# Patient Record
Sex: Female | Born: 1985 | Race: White | Hispanic: No | Marital: Married | Smoking: Current every day smoker
Health system: Southern US, Community
[De-identification: ages and names within clinical notes are randomized; demographics above are authoritative.]

## PROBLEM LIST (undated history)

## (undated) DIAGNOSIS — M51369 Other intervertebral disc degeneration, lumbar region without mention of lumbar back pain or lower extremity pain: Secondary | ICD-10-CM

## (undated) DIAGNOSIS — M5136 Other intervertebral disc degeneration, lumbar region: Secondary | ICD-10-CM

## (undated) DIAGNOSIS — N390 Urinary tract infection, site not specified: Secondary | ICD-10-CM

## (undated) DIAGNOSIS — B977 Papillomavirus as the cause of diseases classified elsewhere: Secondary | ICD-10-CM

## (undated) DIAGNOSIS — F17200 Nicotine dependence, unspecified, uncomplicated: Secondary | ICD-10-CM

## (undated) HISTORY — DX: Other intervertebral disc degeneration, lumbar region without mention of lumbar back pain or lower extremity pain: M51.369

## (undated) HISTORY — DX: Nicotine dependence, unspecified, uncomplicated: F17.200

## (undated) HISTORY — PX: TONSILLECTOMY AND ADENOIDECTOMY: SUR1326

## (undated) HISTORY — DX: Papillomavirus as the cause of diseases classified elsewhere: B97.7

## (undated) HISTORY — DX: Urinary tract infection, site not specified: N39.0

## (undated) HISTORY — DX: Other intervertebral disc degeneration, lumbar region: M51.36

---

## 2006-05-27 ENCOUNTER — Emergency Department: Payer: Self-pay | Admitting: Emergency Medicine

## 2008-06-14 ENCOUNTER — Ambulatory Visit: Payer: Self-pay | Admitting: Family Medicine

## 2009-03-25 ENCOUNTER — Ambulatory Visit: Payer: Self-pay | Admitting: Internal Medicine

## 2009-08-23 ENCOUNTER — Emergency Department: Payer: Self-pay | Admitting: Emergency Medicine

## 2010-03-01 ENCOUNTER — Ambulatory Visit: Payer: Self-pay | Admitting: Unknown Physician Specialty

## 2013-06-26 ENCOUNTER — Ambulatory Visit: Payer: Self-pay | Admitting: Family Medicine

## 2013-09-06 ENCOUNTER — Emergency Department: Payer: Self-pay | Admitting: Emergency Medicine

## 2013-09-16 ENCOUNTER — Ambulatory Visit: Payer: Self-pay | Admitting: Family Medicine

## 2015-06-20 ENCOUNTER — Encounter: Payer: Self-pay | Admitting: Family Medicine

## 2015-06-20 ENCOUNTER — Ambulatory Visit (INDEPENDENT_AMBULATORY_CARE_PROVIDER_SITE_OTHER): Payer: Managed Care, Other (non HMO) | Admitting: Family Medicine

## 2015-06-20 VITALS — BP 99/67 | HR 68 | Resp 16 | Ht 65.0 in | Wt 213.0 lb

## 2015-06-20 DIAGNOSIS — M5136 Other intervertebral disc degeneration, lumbar region: Secondary | ICD-10-CM

## 2015-06-20 MED ORDER — CYCLOBENZAPRINE HCL 10 MG PO TABS: ORAL_TABLET | ORAL | Status: DC

## 2015-06-20 MED ORDER — MELOXICAM 7.5 MG PO TABS: ORAL_TABLET | ORAL | Status: DC

## 2015-06-20 NOTE — Patient Instructions (Signed)
Alternate ice and heat to low back area.
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
## 2015-10-29 ENCOUNTER — Ambulatory Visit (INDEPENDENT_AMBULATORY_CARE_PROVIDER_SITE_OTHER): Payer: Managed Care, Other (non HMO) | Admitting: Family Medicine

## 2015-10-29 VITALS — BP 113/75 | HR 79 | Temp 97.9°F | Resp 16 | Ht 65.0 in | Wt 216.0 lb

## 2015-10-29 DIAGNOSIS — R61 Generalized hyperhidrosis: Secondary | ICD-10-CM

## 2015-10-29 DIAGNOSIS — M5136 Other intervertebral disc degeneration, lumbar region: Secondary | ICD-10-CM | POA: Diagnosis not present

## 2015-10-29 MED ORDER — KETOROLAC TROMETHAMINE 30 MG/ML IJ SOLN: 30.0000 mg | Freq: Once | INTRAMUSCULAR | Status: DC

## 2015-10-29 MED ORDER — CYCLOBENZAPRINE HCL 10 MG PO TABS: ORAL_TABLET | ORAL | Status: DC

## 2015-10-29 MED ORDER — KETOROLAC TROMETHAMINE 10 MG PO TABS: 10.0000 mg | ORAL_TABLET | Freq: Four times a day (QID) | ORAL | Status: DC | PRN

## 2015-10-29 MED ORDER — KETOROLAC TROMETHAMINE 30 MG/ML IJ SOLN
30.0000 mg | Freq: Once | INTRAMUSCULAR | Status: AC
  Administered 2015-10-29: 30 mg via INTRAMUSCULAR

## 2015-10-29 MED ORDER — ALUMINUM CHLORIDE 20 % EX SOLN: Freq: Every day | CUTANEOUS | Status: DC

## 2015-10-29 NOTE — Patient Instructions (Addendum)
If you develop progressive numbness, weakness, loss of feeling in your pelvis, or loss of bowel/bladder control please seek immediate medical attention.   We will give you a toradol injection today.  6-8 hours after your injection you can take a toradol pill.  Repeat every 8 hours until bottle is empty as needed.  Use muscle relaxants as needed.  Try core strengthening to prevent back injury- focus on strengthening your transverse abdominal muscles.
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
## 2015-10-29 NOTE — Assessment & Plan Note (Signed)
Toradol injection for pain. PO toradol for home. Continue meloxicam 24 hours after completing toradol.  Refill flexeril PRN.  Consider PT.

## 2016-02-01 ENCOUNTER — Ambulatory Visit
Admission: EM | Admit: 2016-02-01 | Discharge: 2016-02-01 | Disposition: A | Discharge status: Home / Self Care | Payer: Managed Care, Other (non HMO) | Attending: Family Medicine | Admitting: Family Medicine

## 2016-02-01 ENCOUNTER — Encounter: Payer: Self-pay | Admitting: *Deleted

## 2016-02-01 DIAGNOSIS — J0100 Acute maxillary sinusitis, unspecified: Secondary | ICD-10-CM

## 2016-02-01 MED ORDER — FLUCONAZOLE 150 MG PO TABS: ORAL_TABLET | ORAL | Status: DC

## 2016-02-01 MED ORDER — AMOXICILLIN-POT CLAVULANATE 875-125 MG PO TABS: 1.0000 | ORAL_TABLET | Freq: Two times a day (BID) | ORAL | Status: DC

## 2016-02-01 MED ORDER — HYDROCOD POLST-CPM POLST ER 10-8 MG/5ML PO SUER: 5.0000 mL | Freq: Two times a day (BID) | ORAL | Status: DC

## 2016-02-01 NOTE — ED Notes (Signed)
Bilat ear pain x2 weeks, onset yesterday of headache, runny nose, productive cough- green, and chest soreness with coughing and movement. Denies fever.

## 2016-02-01 NOTE — ED Provider Notes (Signed)
CSN: [id number]     Arrival date & time 02/01/16  1134 History   First MD Initiated Contact with Patient 02/01/16 1308     Chief Complaint  Patient presents with  . Cough  . Headache  . Nasal Congestion  . Pleurisy  . Otalgia   (Consider location/radiation/quality/duration/timing/severity/associated sxs/prior Treatment) HPI  This a 30 year old female who presents with a two-week history of bilateral ear pain, postnasal drip, headache and runny nose. She also complains of a productive cough green sputum and chest soreness when she is coughing. Denies any fever or chills. She does have a great deal of coughing at nighttime. Denies any wheezing. She's had no fever or chills and is afebrile today.    Past Medical History  Diagnosis Date  . UTI (urinary tract infection)   . Smoker    Past Surgical History  Procedure Laterality Date  . Tonsillectomy and adenoidectomy     Family History  Problem Relation Age of Onset  . COPD Father    Social History  Substance Use Topics  . Smoking status: Current Every Day Smoker -- 0.50 packs/day    Types: Cigarettes  . Smokeless tobacco: Never Used  . Alcohol Use: Yes   OB History    No data available     Review of Systems  Constitutional: Positive for activity change. Negative for fever, chills and fatigue.  HENT: Positive for congestion, ear pain, postnasal drip, rhinorrhea, sinus pressure, sneezing and sore throat.   All other systems reviewed and are negative.   Allergies  Review of patient's allergies indicates no known allergies.  Home Medications   Prior to Admission medications   Medication Sig Start Date End Date Taking? Authorizing Provider  aluminum chloride (DRYSOL) 20 % external solution Apply topically at bedtime. 10/29/15  Yes Amy Rusty Aus, NP  amoxicillin-clavulanate (AUGMENTIN) 875-125 MG tablet Take 1 tablet by mouth every 12 (twelve) hours. 02/01/16   Lutricia Feil, PA-C  chlorpheniramine-HYDROcodone  (TUSSIONEX PENNKINETIC ER) 10-8 MG/5ML SUER Take 5 mLs by mouth 2 (two) times daily. 02/01/16   Lutricia Feil, PA-C  cyclobenzaprine (FLEXERIL) 10 MG tablet 1/2-1 tablet up to three times a day for muscle spasm. 10/29/15   Amy Rusty Aus, NP  fluconazole (DIFLUCAN) 150 MG tablet Take one tab for symptoms of yeast infection. Repeat x 1 in 72 hours. 02/01/16   Lutricia Feil, PA-C  ibuprofen (ADVIL,MOTRIN) 600 MG tablet Take 600 mg by mouth every 6 (six) hours as needed.    Historical Provider, MD  meloxicam (MOBIC) 7.5 MG tablet 1-2 tabs once daily for back pain 06/20/15   Janeann Forehand., MD   Meds Ordered and Administered this Visit  Medications - No data to display  BP 115/73 mmHg  Pulse 78  Temp(Src) 98.2 F (36.8 C) (Oral)  Resp 16  Ht  (1.651 m)  Wt 205 lb (92.987 kg)  BMI 34.11 kg/m2  SpO2 97%  LMP 01/28/2016 (Exact Date) No data found.   Physical Exam  Constitutional: She is oriented to person, place, and time. She appears well-developed and well-nourished. No distress.  HENT:  Head: Normocephalic and atraumatic.  Right Ear: External ear normal.  Left Ear: External ear normal.  Nose: Nose normal.  Mouth/Throat: Oropharynx is clear and moist. No oropharyngeal exudate.  Eyes: Conjunctivae are normal. Pupils are equal, round, and reactive to light.  Neck: Normal range of motion. Neck supple.  Pulmonary/Chest: Effort normal and breath sounds normal. No stridor. No  respiratory distress. She has no wheezes. She has no rales.  Musculoskeletal: Normal range of motion. She exhibits no edema or tenderness.  Lymphadenopathy:    She has no cervical adenopathy.  Neurological: She is alert and oriented to person, place, and time.  Skin: Skin is warm and dry. No rash noted. She is not diaphoretic. No erythema.  Psychiatric: She has a normal mood and affect. Her behavior is normal. Judgment and thought content normal.  Nursing note and vitals reviewed.   ED Course   Procedures (including critical care time)  Labs Review Labs Reviewed - No data to display  Imaging Review No results found.   Visual Acuity Review  Right Eye Distance:   Left Eye Distance:   Bilateral Distance:    Right Eye Near:   Left Eye Near:    Bilateral Near:         MDM   1. Acute maxillary sinusitis, recurrence not specified    Discharge Medication List as of 02/01/2016  1:34 PM    START taking these medications   Details  amoxicillin-clavulanate (AUGMENTIN) 875-125 MG tablet Take 1 tablet by mouth every 12 (twelve) hours., Starting 02/01/2016, Until Discontinued, Normal    chlorpheniramine-HYDROcodone (TUSSIONEX PENNKINETIC ER) 10-8 MG/5ML SUER Take 5 mLs by mouth 2 (two) times daily., Starting 02/01/2016, Until Discontinued, Print    fluconazole (DIFLUCAN) 150 MG tablet Take one tab for symptoms of yeast infection. Repeat x 1 in 72 hours., Normal      Plan: 1. Test/x-ray results and diagnosis reviewed with patient 2. rx as per orders; risks, benefits, potential side effects reviewed with patient 3. Recommend supportive treatment with Rest and fluids. Recommend use of Flonase for her nasal symptoms. She has had previous symptoms for about 2 weeks believe that antibiotics would be prudent at this point. She states that she is able to used infections as of also provide her with Diflucan is not improving or is worsening she should follow-up with her primary care physician 4. F/u prn if symptoms worsen or don't improve     Lutricia FeilWilliam P Roemer, PA-C 02/01/16 1353

## 2016-02-01 NOTE — Discharge Instructions (Signed)
Sinus Rinse WHAT IS A SINUS RINSE? A sinus rinse is a simple home treatment that is used to rinse your sinuses with a sterile mixture of salt and water (saline solution). Sinuses are air-filled spaces in your skull behind the bones of your face and forehead that open into your nasal cavity. You will use the following:  Saline solution.  Neti pot or spray bottle. This releases the saline solution into your nose and through your sinuses. Neti pots and spray bottles can be purchased at your local pharmacy, a health food store, or online. WHEN WOULD I DO A SINUS RINSE? A sinus rinse can help to clear mucus, dirt, dust, or pollen from the nasal cavity. You may do a sinus rinse when you have a cold, a virus, nasal allergy symptoms, a sinus infection, or stuffiness in the nose or sinuses. If you are considering a sinus rinse:  Ask your child's health care provider before performing a sinus rinse on your child.  Do not do a sinus rinse if you have had ear or nasal surgery, ear infection, or blocked ears. HOW DO I DO A SINUS RINSE?  Wash your hands.  Disinfect your device according to the directions provided and then dry it.  Use the solution that comes with your device or one that is sold separately in stores. Follow the mixing directions on the package.  Fill your device with the amount of saline solution as directed by the device instructions.  Stand over a sink and tilt your head sideways over the sink.  Place the spout of the device in your upper nostril (the one closer to the ceiling).  Gently pour or squeeze the saline solution into the nasal cavity. The liquid should drain to the lower nostril if you are not overly congested.  Gently blow your nose. Blowing too hard may cause ear pain.  Repeat in the other nostril.  Clean and rinse your device with clean water and then air-dry it. ARE THERE RISKS OF A SINUS RINSE?  Sinus rinse is generally very safe and effective. However, there  are a few risks, which include:   A burning sensation in the sinuses. This may happen if you do not make the saline solution as directed. Make sure to follow all directions when making the saline solution.  Infection from contaminated water. This is rare, but possible.  Nasal irritation.   This information is not intended to replace advice given to you by your health care provider. Make sure you discuss any questions you have with your health care provider.   Document Released: 04/12/2014 Document Reviewed: 04/12/2014 Elsevier Interactive Patient Education 2016 [employer]  Sinusitis, Adult Sinusitis is redness, soreness, and inflammation of the paranasal sinuses. Paranasal sinuses are air pockets within the bones of your face. They are located beneath your eyes, in the middle of your forehead, and above your eyes. In healthy paranasal sinuses, mucus is able to drain out, and air is able to circulate through them by way of your nose. However, when your paranasal sinuses are inflamed, mucus and air can become trapped. This can allow bacteria and other germs to grow and cause infection. Sinusitis can develop quickly and last only a short time (acute) or continue over a long period (chronic). Sinusitis that lasts for more than 12 weeks is considered chronic. CAUSES Causes of sinusitis include:  Allergies.  Structural abnormalities, such as displacement of the cartilage that separates your nostrils (deviated septum), which can decrease the air flow   through your nose and sinuses and affect sinus drainage.  Functional abnormalities, such as when the small hairs (cilia) that line your sinuses and help remove mucus do not work properly or are not present. SIGNS AND SYMPTOMS Symptoms of acute and chronic sinusitis are the same. The primary symptoms are pain and pressure around the affected sinuses. Other symptoms include:  Upper toothache.  Earache.  Headache.  Bad breath.  Decreased  sense of smell and taste.  A cough, which worsens when you are lying flat.  Fatigue.  Fever.  Thick drainage from your nose, which often is green and may contain pus (purulent).  Swelling and warmth over the affected sinuses. DIAGNOSIS Your health care provider will perform a physical exam. During your exam, your health care provider may perform any of the following to help determine if you have acute sinusitis or chronic sinusitis:  Look in your nose for signs of abnormal growths in your nostrils (nasal polyps).  Tap over the affected sinus to check for signs of infection.  View the inside of your sinuses using an imaging device that has a light attached (endoscope). If your health care provider suspects that you have chronic sinusitis, one or more of the following tests may be recommended:  Allergy tests.  Nasal culture. A sample of mucus is taken from your nose, sent to a lab, and screened for bacteria.  Nasal cytology. A sample of mucus is taken from your nose and examined by your health care provider to determine if your sinusitis is related to an allergy. TREATMENT Most cases of acute sinusitis are related to a viral infection and will resolve on their own within 10 days. Sometimes, medicines are prescribed to help relieve symptoms of both acute and chronic sinusitis. These may include pain medicines, decongestants, nasal steroid sprays, or saline sprays. However, for sinusitis related to a bacterial infection, your health care provider will prescribe antibiotic medicines. These are medicines that will help kill the bacteria causing the infection. Rarely, sinusitis is caused by a fungal infection. In these cases, your health care provider will prescribe antifungal medicine. For some cases of chronic sinusitis, surgery is needed. Generally, these are cases in which sinusitis recurs more than 3 times per year, despite other treatments. HOME CARE INSTRUCTIONS  Drink plenty of  water. Water helps thin the mucus so your sinuses can drain more easily.  Use a humidifier.  Inhale steam 3-4 times a day (for example, sit in the bathroom with the shower running).  Apply a warm, moist washcloth to your face 3-4 times a day, or as directed by your health care provider.  Use saline nasal sprays to help moisten and clean your sinuses.  Take medicines only as directed by your health care provider.  If you were prescribed either an antibiotic or antifungal medicine, finish it all even if you start to feel better. SEEK IMMEDIATE MEDICAL CARE IF:  You have increasing pain or severe headaches.  You have nausea, vomiting, or drowsiness.  You have swelling around your face.  You have vision problems.  You have a stiff neck.  You have difficulty breathing.   This information is not intended to replace advice given to you by your health care provider. Make sure you discuss any questions you have with your health care provider.   Document Released: 09/15/2005 Document Revised: 10/06/2014 Document Reviewed: 09/30/2011 Elsevier Interactive Patient Education 2016 [employer]  

## 2016-11-19 ENCOUNTER — Other Ambulatory Visit: Payer: Self-pay | Admitting: Family Medicine

## 2016-11-19 DIAGNOSIS — M5136 Other intervertebral disc degeneration, lumbar region: Secondary | ICD-10-CM

## 2016-11-21 ENCOUNTER — Ambulatory Visit (INDEPENDENT_AMBULATORY_CARE_PROVIDER_SITE_OTHER): Payer: Managed Care, Other (non HMO) | Admitting: Physician Assistant

## 2016-11-21 ENCOUNTER — Encounter: Payer: Self-pay | Admitting: Physician Assistant

## 2016-11-21 VITALS — BP 121/54 | HR 67 | Temp 97.5°F | Resp 16 | Ht 65.0 in | Wt 196.0 lb

## 2016-11-21 DIAGNOSIS — M549 Dorsalgia, unspecified: Secondary | ICD-10-CM

## 2016-11-21 DIAGNOSIS — R61 Generalized hyperhidrosis: Secondary | ICD-10-CM

## 2016-11-21 DIAGNOSIS — M5136 Other intervertebral disc degeneration, lumbar region: Secondary | ICD-10-CM

## 2016-11-21 LAB — POCT URINALYSIS DIPSTICK
Bilirubin, UA: NEGATIVE
Blood, UA: NEGATIVE
Glucose, UA: NEGATIVE
Ketones, UA: NEGATIVE
Leukocytes, UA: NEGATIVE
Nitrite, UA: NEGATIVE
Protein, UA: NEGATIVE
Spec Grav, UA: <=1.005
Urobilinogen, UA: 0.2
pH, UA: 8

## 2016-11-21 MED ORDER — KETOROLAC TROMETHAMINE 30 MG/ML IJ SOLN
30.0000 mg | Freq: Once | INTRAMUSCULAR | Status: AC
  Administered 2016-11-21: 30 mg via INTRAMUSCULAR

## 2016-11-21 MED ORDER — ALUMINUM CHLORIDE 20 % EX SOLN: Freq: Every day | CUTANEOUS | 0 refills | Status: DC

## 2016-11-21 MED ORDER — CYCLOBENZAPRINE HCL 10 MG PO TABS: ORAL_TABLET | ORAL | 0 refills | Status: DC

## 2016-11-21 MED ORDER — KETOROLAC TROMETHAMINE 10 MG PO TABS: 10.0000 mg | ORAL_TABLET | Freq: Four times a day (QID) | ORAL | 0 refills | Status: AC | PRN
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
## 2016-11-21 NOTE — Patient Instructions (Signed)
Back Exercises Introduction If you have pain in your back, do these exercises 2-3 times each day or as told by your doctor. When the pain goes away, do the exercises once each day, but repeat the steps more times for each exercise (do more repetitions). If you do not have pain in your back, do these exercises once each day or as told by your doctor. Exercises Single Knee to Chest  Do these steps 3-5 times in a row for each leg: 1. Lie on your back on a firm bed or the floor with your legs stretched out. 2. Bring one knee to your chest. 3. Hold your knee to your chest by grabbing your knee or thigh. 4. Pull on your knee until you feel a gentle stretch in your lower back. 5. Keep doing the stretch for 10-30 seconds. 6. Slowly let go of your leg and straighten it. Pelvic Tilt  Do these steps 5-10 times in a row: 1. Lie on your back on a firm bed or the floor with your legs stretched out. 2. Bend your knees so they point up to the ceiling. Your feet should be flat on the floor. 3. Tighten your lower belly (abdomen) muscles to press your lower back against the floor. This will make your tailbone point up to the ceiling instead of pointing down to your feet or the floor. 4. Stay in this position for 5-10 seconds while you gently tighten your muscles and breathe evenly. Cat-Cow  Do these steps until your lower back bends more easily: 1. Get on your hands and knees on a firm surface. Keep your hands under your shoulders, and keep your knees under your hips. You may put padding under your knees. 2. Let your head hang down, and make your tailbone point down to the floor so your lower back is round like the back of a cat. 3. Stay in this position for 5 seconds. 4. Slowly lift your head and make your tailbone point up to the ceiling so your back hangs low (sags) like the back of a cow. 5. Stay in this position for 5 seconds. Press-Ups  Do these steps 5-10 times in a row: 1. Lie on your belly  (face-down) on the floor. 2. Place your hands near your head, about shoulder-width apart. 3. While you keep your back relaxed and keep your hips on the floor, slowly straighten your arms to raise the top half of your body and lift your shoulders. Do not use your back muscles. To make yourself more comfortable, you may change where you place your hands. 4. Stay in this position for 5 seconds. 5. Slowly return to lying flat on the floor. Bridges  Do these steps 10 times in a row: 1. Lie on your back on a firm surface. 2. Bend your knees so they point up to the ceiling. Your feet should be flat on the floor. 3. Tighten your butt muscles and lift your butt off of the floor until your waist is almost as high as your knees. If you do not feel the muscles working in your butt and the back of your thighs, slide your feet 1-2 inches farther away from your butt. 4. Stay in this position for 3-5 seconds. 5. Slowly lower your butt to the floor, and let your butt muscles relax. If this exercise is too easy, try doing it with your arms crossed over your chest. Belly Crunches  Do these steps 5-10 times in a row: 1. Lie   on your back on a firm bed or the floor with your legs stretched out. 2. Bend your knees so they point up to the ceiling. Your feet should be flat on the floor. 3. Cross your arms over your chest. 4. Tip your chin a little bit toward your chest but do not bend your neck. 5. Tighten your belly muscles and slowly raise your chest just enough to lift your shoulder blades a tiny bit off of the floor. 6. Slowly lower your chest and your head to the floor. Back Lifts  Do these steps 5-10 times in a row: 1. Lie on your belly (face-down) with your arms at your sides, and rest your forehead on the floor. 2. Tighten the muscles in your legs and your butt. 3. Slowly lift your chest off of the floor while you keep your hips on the floor. Keep the back of your head in line with the curve in your back.  Look at the floor while you do this. 4. Stay in this position for 3-5 seconds. 5. Slowly lower your chest and your face to the floor. Contact a doctor if:  Your back pain gets a lot worse when you do an exercise.  Your back pain does not lessen 2 hours after you exercise. If you have any of these problems, stop doing the exercises. Do not do them again unless your doctor says it is okay. Get help right away if:  You have sudden, very bad back pain. If this happens, stop doing the exercises. Do not do them again unless your doctor says it is okay. This information is not intended to replace advice given to you by your health care provider. Make sure you discuss any questions you have with your health care provider. Document Released: 10/18/2010 Document Revised: 02/21/2016 Document Reviewed: 11/09/2014  2017 [employer]vier  

## 2017-03-03 ENCOUNTER — Ambulatory Visit (INDEPENDENT_AMBULATORY_CARE_PROVIDER_SITE_OTHER): Payer: 59 | Admitting: Nurse Practitioner

## 2017-03-03 ENCOUNTER — Encounter: Payer: Self-pay | Admitting: Nurse Practitioner

## 2017-03-03 VITALS — BP 109/65 | HR 77 | Ht 65.0 in | Wt 195.2 lb

## 2017-03-03 DIAGNOSIS — M5136 Other intervertebral disc degeneration, lumbar region: Secondary | ICD-10-CM

## 2017-03-03 DIAGNOSIS — M545 Low back pain, unspecified: Secondary | ICD-10-CM

## 2017-03-03 MED ORDER — BACLOFEN 10 MG PO TABS: 5.0000 mg | ORAL_TABLET | Freq: Three times a day (TID) | ORAL | 0 refills | Status: DC

## 2017-03-03 MED ORDER — MELOXICAM 7.5 MG PO TABS: 7.5000 mg | ORAL_TABLET | Freq: Every day | ORAL | 0 refills | Status: DC

## 2017-03-03 NOTE — Patient Instructions (Addendum)
Kimberly Neal, Thank you for coming in to clinic today.  1. For your back pain - Start taking Tylenol extra strength 1 to 2 tablets every 6-8 hours for aches  for next few days as needed.  Do not take more than 3,000 mg in 24 hours from all medicines.  Can take on same day as taking meloxicam 7.5 mg once daily if needed.  Limit to 2 weeks at a time. - Use heat and ice.  Apply this for 15 minutes at a time 6-8 times per day.   - Muscle rub with lidocaine.  Avoid using this with heat and ice to avoid burns. - Tiger Balm is another option. - Take baclofen 10 mg tablet. Take 1/2 to 1 pill as needed for spasm.  Please schedule a follow-up appointment with Wilhelmina McardleLauren Jacobo Moncrief, AGNP to Return in about 2 months (around 05/03/2017) for annual physical.  If you have any other questions or concerns, please feel free to call the clinic or send a message through MyChart. You may also schedule an earlier appointment if necessary.  Wilhelmina McardleLauren Makell Drohan, DNP, AGNP-BC Adult Gerontology Nurse Practitioner [facility], [facility]              Low Back Pain Exercises  See other page with pictures of each exercise.  Start with 1 or 2 of these exercises that you are most comfortable with. Do not do any exercises that cause you significant worsening pain. Some of these may cause some "stretching soreness" but it should go away after you stop the exercise, and get better over time. Gradually increase up to 3-4 exercises as tolerated.  Standing hamstring stretch: Place the heel of your leg on a stool about 15 inches high. Keep your knee straight. Lean forward, bending at the hips until you feel a mild stretch in the back of your thigh. Make sure you do not roll your shoulders and bend at the waist when doing this or you will stretch your lower back instead. Hold the stretch for 15 to 30 seconds. Repeat 3 times. Repeat the same stretch on your other leg.  Cat and camel: Get down on your hands and knees. Let your  stomach sag, allowing your back to curve downward. Hold this position for 5 seconds. Then arch your back and hold for 5 seconds. Do 3 sets of 10.  Quadriped Arm/Leg Raises: Get down on your hands and knees. Tighten your abdominal muscles to stiffen your spine. While keeping your abdominals tight, raise one arm and the opposite leg away from you. Hold this position for 5 seconds. Lower your arm and leg slowly and alternate sides. Do this 10 times on each side.  Pelvic tilt: Lie on your back with your knees bent and your feet flat on the floor. Tighten your abdominal muscles and push your lower back into the floor. Hold this position for 5 seconds, then relax. Do 3 sets of 10.  Partial curl: Lie on your back with your knees bent and your feet flat on the floor. Tighten your stomach muscles and flatten your back against the floor. Tuck your chin to your chest. With your hands stretched out in front of you, curl your upper body forward until your shoulders clear the floor. Hold this position for 3 seconds. Don't hold your breath. It helps to breathe out as you lift your shoulders up. Relax. Repeat 10 times. Build to 3 sets of 10. To challenge yourself, clasp your hands behind your head and keep  your elbows out to the side.  Lower trunk rotation: Lie on your back with your knees bent and your feet flat on the floor. Tighten your abdominal muscles and push your lower back into the floor. Keeping your shoulders down flat, gently rotate your legs to one side, then the other as far as you can. Repeat 10 to 20 times.  Single knee to chest stretch: Lie on your back with your legs straight out in front of you. Bring one knee up to your chest and grasp the back of your thigh. Pull your knee toward your chest, stretching your buttock muscle. Hold this position for 15 to 30 seconds and return to the starting position. Repeat 3 times on each side.  Double knee to chest: Lie on your back with your knees bent and your  feet flat on the floor. Tighten your abdominal muscles and push your lower back into the floor. Pull both knees up to your chest. Hold for 5 seconds and repeat 10 to 20 times.

## 2017-03-03 NOTE — Assessment & Plan Note (Signed)
Stable, unchanged course.  Recent acute exacerbation follows pattern of exacerbation about every 2-3 months.  Plan: 1. Continue meloxicam as needed for back pain.   Take intermittently if not needed daily. 2. Refill flexeril 10 mg tid prn.   3. Discussed PT for concomitant therapy w/ chiropractic care. Need muscle strengthening/release with bone manipulation.  4. Encouraged regular exercise and core strenghtening. 5.  Apply heat and/or ice to affected area. 6. May also apply a muscle rub with lidocaine or tiger balm after heat or ice.

## 2017-03-03 NOTE — Progress Notes (Signed)
I have reviewed this encounter including the documentation in this note and/or discussed this patient with the provider, Wilhelmina McardleLauren Kennedy, AGPCNP-BC. I am certifying that I agree with the content of this note as supervising physician.  Saralyn PilarAlexander Noble Cicalese, DO [facility] 03/03/2017, 6:32 PM
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
## 2017-03-13 ENCOUNTER — Emergency Department: Payer: 59

## 2017-03-13 ENCOUNTER — Emergency Department
Admission: EM | Admit: 2017-03-13 | Discharge: 2017-03-14 | Disposition: A | Discharge status: Home / Self Care | Payer: 59 | Attending: Emergency Medicine | Admitting: Emergency Medicine

## 2017-03-13 DIAGNOSIS — Z79899 Other long term (current) drug therapy: Secondary | ICD-10-CM | POA: Diagnosis not present

## 2017-03-13 DIAGNOSIS — Y92320 Baseball field as the place of occurrence of the external cause: Secondary | ICD-10-CM | POA: Insufficient documentation

## 2017-03-13 DIAGNOSIS — W2189XA Striking against or struck by other sports equipment, initial encounter: Secondary | ICD-10-CM | POA: Insufficient documentation

## 2017-03-13 DIAGNOSIS — M25561 Pain in right knee: Secondary | ICD-10-CM

## 2017-03-13 DIAGNOSIS — S8992XA Unspecified injury of left lower leg, initial encounter: Secondary | ICD-10-CM | POA: Diagnosis present

## 2017-03-13 DIAGNOSIS — Y9364 Activity, baseball: Secondary | ICD-10-CM | POA: Diagnosis not present

## 2017-03-13 DIAGNOSIS — S80212A Abrasion, left knee, initial encounter: Secondary | ICD-10-CM | POA: Insufficient documentation

## 2017-03-13 DIAGNOSIS — F17210 Nicotine dependence, cigarettes, uncomplicated: Secondary | ICD-10-CM | POA: Diagnosis not present

## 2017-03-13 DIAGNOSIS — Y999 Unspecified external cause status: Secondary | ICD-10-CM | POA: Insufficient documentation

## 2017-03-13 DIAGNOSIS — M25562 Pain in left knee: Secondary | ICD-10-CM

## 2017-03-13 DIAGNOSIS — T148XXA Other injury of unspecified body region, initial encounter: Secondary | ICD-10-CM

## 2017-03-13 MED ORDER — HYDROCODONE-ACETAMINOPHEN 5-325 MG PO TABS
1.0000 | ORAL_TABLET | Freq: Once | ORAL | Status: AC
  Administered 2017-03-14: 1 via ORAL
  Filled 2017-03-13: qty 1

## 2017-03-13 MED ORDER — TETANUS-DIPHTH-ACELL PERTUSSIS 5-2.5-18.5 LF-MCG/0.5 IM SUSP
0.5000 mL | Freq: Once | INTRAMUSCULAR | Status: AC
Start: 2017-03-13 — End: 2017-03-14
  Administered 2017-03-14: 0.5 mL via INTRAMUSCULAR
  Filled 2017-03-13: qty 0.5

## 2017-03-13 MED ORDER — BACITRACIN-NEOMYCIN-POLYMYXIN 400-5-5000 EX OINT
TOPICAL_OINTMENT | Freq: Once | CUTANEOUS | Status: AC
  Administered 2017-03-14: 1 via TOPICAL
  Filled 2017-03-13: qty 1

## 2017-03-13 NOTE — ED Triage Notes (Signed)
Pt to triage via wheelchair. Pt reports she fell playing softball landing on her left knee. Abrasion noted to left knee. Pt denies other injury.

## 2017-03-13 NOTE — ED Provider Notes (Signed)
[facility] Emergency Department Provider Note  ____________________________________________  Time seen: Approximately 11:42 PM  I have reviewed the triage vital signs and the nursing notes.   HISTORY  Chief Complaint Fall and Knee Pain    HPI Kimberly Neal is a 31 y.o. female that presents to the emergency department with left knee pain after falling during softball tonight. Patient states that she was running to first base when she fell on both knees. She has abrasions on both knees but pain is in her left knee. She has been walking since fall. She did not hit her head or lose consciousness. She does not remember her last tetanus shot. She denies headache, shortness of breath, chest pain, nausea, vomiting, abdominal pain, numbness, tingling.    Past Medical History:  Diagnosis Date  . Smoker   . UTI (urinary tract infection)     Patient Active Problem List   Diagnosis Date Noted  . Degenerative disc disease, lumbar 06/20/2015    Past Surgical History:  Procedure Laterality Date  . TONSILLECTOMY AND ADENOIDECTOMY      Prior to Admission medications   Medication Sig Start Date End Date Taking? Authorizing Provider  aluminum chloride (DRYSOL) 20 % external solution Apply topically at bedtime. 11/21/16   Trey Sailors, PA-C  baclofen (LIORESAL) 10 MG tablet Take 0.5-1 tablets (5-10 mg total) by mouth 3 (three) times daily. 03/03/17   Galen Manila, NP  cyclobenzaprine (FLEXERIL) 10 MG tablet 1/2-1 tablet up to three times a day for muscle spasm. 11/21/16   Trey Sailors, PA-C  ibuprofen (ADVIL,MOTRIN) 600 MG tablet Take 600 mg by mouth every 6 (six) hours as needed.    [provider]  meloxicam (MOBIC) 7.5 MG tablet Take 1 tablet (7.5 mg total) by mouth daily. Needs an appointment with Dr. Althea Charon before getting more refills of this. 03/03/17   Galen Manila, NP    Allergies Patient has no known allergies.  Family  History  Problem Relation Age of Onset  . COPD Father     Social History Social History  Substance Use Topics  . Smoking status: Current Every Day Smoker    Packs/day: 0.50    Types: Cigarettes  . Smokeless tobacco: Never Used  . Alcohol use Yes     Review of Systems  Constitutional: No fever/chills Cardiovascular: No chest pain. Respiratory: No SOB. Gastrointestinal: No abdominal pain.  No nausea, no vomiting.  Musculoskeletal: Positive for knee pain  Skin: Negative for rash, lacerations, ecchymosis. Positive for abrasion  Neurological: Negative for headaches, numbness or tingling   ____________________________________________   PHYSICAL EXAM:  VITAL SIGNS: ED Triage Vitals  Enc Vitals Group     BP 03/13/17 2308 (!) 148/49     Pulse Rate 03/13/17 2308 89     Resp 03/13/17 2308 20     Temp 03/13/17 2308 98.4 F (36.9 C)     Temp Source 03/13/17 2308 Oral     SpO2 03/13/17 2308 100 %     Weight 03/13/17 2258 195 lb (88.5 kg)     Height 03/13/17 2258 5\' 5"  (1.651 m)     Head Circumference --      Peak Flow --      Pain Score 03/13/17 2257 8     Pain Loc --      Pain Edu? --      Excl. in GC? --      Constitutional: Alert and oriented. Well appearing and in  no acute distress. Eyes: Conjunctivae are normal. PERRL. EOMI. Head: Atraumatic. ENT:      Ears:      Nose: No congestion/rhinnorhea.      Mouth/Throat: Mucous membranes are moist.  Neck: No stridor.   Cardiovascular: Normal rate, regular rhythm.  Good peripheral circulation. Respiratory: Normal respiratory effort without tachypnea or retractions. Lungs CTAB. Good air entry to the bases with no decreased or absent breath sounds. Musculoskeletal: Full range of motion to all extremities. No gross deformities appreciated. Neurologic:  Normal speech and language. No gross focal neurologic deficits are appreciated.  Skin:  Skin is warm, dry. 1 inch abrasion to right and left kneecaps. No  swelling.   ____________________________________________   LABS (all labs ordered are listed, but only abnormal results are displayed)  Labs Reviewed - No data to display ____________________________________________  EKG   ____________________________________________  RADIOLOGY Lexine BatonI, Lalania Haseman, personally viewed and evaluated these images (plain radiographs) as part of my medical decision making, as well as reviewing the written report by the radiologist.  Dg Knee Complete 4 Views Left  Result Date: 03/13/2017 CLINICAL DATA:  Status post fall while playing softball, with abrasion at the left knee. Initial encounter. EXAM: LEFT KNEE - COMPLETE 4+ VIEW COMPARISON:  None. FINDINGS: There is no evidence of fracture or dislocation. The joint spaces are preserved. No significant degenerative change is seen; the patellofemoral joint is grossly unremarkable in appearance. No significant joint effusion is seen. The visualized soft tissues are normal in appearance. IMPRESSION: No evidence of fracture or dislocation. Electronically Signed   By: Roanna RaiderJeffery  Chang M.D.   On: 03/13/2017 23:31    ____________________________________________    PROCEDURES  Procedure(s) performed:    Procedures    Medications  neomycin-bacitracin-polymyxin (NEOSPORIN) ointment (1 application Topical Given 03/14/17 0020)  Tdap (BOOSTRIX) injection 0.5 mL (0.5 mLs Intramuscular Given 03/14/17 0021)  HYDROcodone-acetaminophen (NORCO/VICODIN) 5-325 MG per tablet 1 tablet (1 tablet Oral Given 03/14/17 0020)     ____________________________________________   INITIAL IMPRESSION / ASSESSMENT AND PLAN / ED COURSE  Pertinent labs & imaging results that were available during my care of the patient were reviewed by me and considered in my medical decision making (see chart for details).  Review of the [place] CSRS was performed in accordance of the [facility] prior to dispensing any controlled drugs.   Patient's diagnosis  is consistent with knee abrasions and musculoskeletal pain. Vital signs and exam are reassuring. Knee x-ray negative for acute bony abnormalities. Patient did not hit head or lose consciousness. Neosporin was applied to the knees and Ace bandage was applied. Crutches were given. Patient is to follow up with PCP as directed. Patient is given ED precautions to return to the ED for any worsening or new symptoms.     ____________________________________________  FINAL CLINICAL IMPRESSION(S) / ED DIAGNOSES  Final diagnoses:  Abrasion  Acute pain of both knees      NEW MEDICATIONS STARTED DURING THIS VISIT:  Discharge Medication List as of 03/13/2017 11:57 PM          This chart was dictated using voice recognition software/Dragon. Despite best efforts to proofread, errors can occur which can change the meaning. Any change was purely unintentional.    Enid DerryWagner, Zuleica Seith, PA-C 03/14/17 0041    Don PerkingVeronese, [place], MD 03/14/17 [phone]

## 2017-03-14 NOTE — ED Notes (Signed)

## 2017-03-16 ENCOUNTER — Ambulatory Visit: Payer: Managed Care, Other (non HMO) | Admitting: Nurse Practitioner

## 2017-06-15 ENCOUNTER — Encounter: Payer: Self-pay | Admitting: *Deleted

## 2017-06-15 ENCOUNTER — Ambulatory Visit
Admission: EM | Admit: 2017-06-15 | Discharge: 2017-06-15 | Disposition: A | Discharge status: Home / Self Care | Payer: Managed Care, Other (non HMO) | Attending: Family Medicine | Admitting: Family Medicine

## 2017-06-15 DIAGNOSIS — J029 Acute pharyngitis, unspecified: Secondary | ICD-10-CM

## 2017-06-15 DIAGNOSIS — R112 Nausea with vomiting, unspecified: Secondary | ICD-10-CM | POA: Diagnosis not present

## 2017-06-15 LAB — RAPID STREP SCREEN (MED CTR MEBANE ONLY): Streptococcus, Group A Screen (Direct): NEGATIVE

## 2017-06-15 MED ORDER — LORATADINE 10 MG PO TABS: 10.0000 mg | ORAL_TABLET | Freq: Every day | ORAL | 0 refills | Status: DC | PRN

## 2017-06-15 NOTE — ED Triage Notes (Signed)
Pt is pregnant and has been vomiting. Friday pt noticed a low grade fever and chills with sore throat

## 2017-06-15 NOTE — ED Provider Notes (Addendum)
[facility]    CSN: [id number] Arrival date & time: 06/15/17  1503     History   Chief Complaint Chief Complaint  Patient presents with  . Sore Throat    HPI Kimberly Neal is a 31 y.o. female.   Patient is a 31 year old white female who presents with sore throat. This sore throat started on Friday but she also is has some postnasal drainage and emesis occurring about the same time as well. She several weeks pregnant and states that she's has some nausea and on Friday she started throwing up as well she states several times a day. She has an appointment with her prenatal provider later this week but has not discussed anything with them yet as far some nausea and vomiting. She reports burning irritation of her throat. Had tonsillectomy and adenoidectomy. She's has a history of recurrent UTIs degenerative disc disease. She stop smoking when she found she was pregnant. No known drug allergies. Her father has COPD.   The history is provided by the patient and the spouse. No language interpreter was used.  Sore Throat  This is a new problem. The current episode started more than 2 days ago. The problem occurs constantly. The problem has been gradually worsening. Pertinent negatives include no chest pain, no abdominal pain, no headaches and no shortness of breath. Nothing aggravates the symptoms. Nothing relieves the symptoms. She has tried nothing for the symptoms. The treatment provided no relief.    Past Medical History:  Diagnosis Date  . Smoker   . UTI (urinary tract infection)     Patient Active Problem List   Diagnosis Date Noted  . Degenerative disc disease, lumbar 06/20/2015    Past Surgical History:  Procedure Laterality Date  . TONSILLECTOMY AND ADENOIDECTOMY      OB History    Gravida Para Term Preterm AB Living   1             SAB TAB Ectopic Multiple Live Births                   Home Medications    Prior to Admission medications     Medication Sig Start Date End Date Taking? Authorizing Provider  cyclobenzaprine (FLEXERIL) 10 MG tablet 1/2-1 tablet up to three times a day for muscle spasm. 11/21/16  Yes Trey Sailors, PA-C  Prenatal Vit-Fe Fumarate-FA (PRENATAL MULTIVITAMIN) TABS tablet Take 1 tablet by mouth daily at 12 noon.   Yes [provider]  aluminum chloride (DRYSOL) 20 % external solution Apply topically at bedtime. 11/21/16   Trey Sailors, PA-C  baclofen (LIORESAL) 10 MG tablet Take 0.5-1 tablets (5-10 mg total) by mouth 3 (three) times daily. 03/03/17   Galen Manila, NP  ibuprofen (ADVIL,MOTRIN) 600 MG tablet Take 600 mg by mouth every 6 (six) hours as needed.    [provider]  loratadine (CLARITIN) 10 MG tablet Take 1 tablet (10 mg total) by mouth daily as needed for allergies. Take 1 tablet in the morning. When necessary for congestion 06/15/17   Hassan Rowan, MD  meloxicam (MOBIC) 7.5 MG tablet Take 1 tablet (7.5 mg total) by mouth daily. Needs an appointment with Dr. Althea Charon before getting more refills of this. 03/03/17   Galen Manila, NP    Family History Family History  Problem Relation Age of Onset  . COPD Father     Social History Social History  Substance Use Topics  . Smoking status: Former  Smoker    Packs/day: 0.50    Types: Cigarettes  . Smokeless tobacco: Never Used  . Alcohol use Yes     Allergies   Patient has no known allergies.   Review of Systems Review of Systems  HENT: Positive for sore throat.   Respiratory: Negative for shortness of breath.   Cardiovascular: Negative for chest pain.  Gastrointestinal: Positive for nausea and vomiting. Negative for abdominal pain.  Neurological: Negative for headaches.  All other systems reviewed and are negative.    Physical Exam Triage Vital Signs ED Triage Vitals  Enc Vitals Group     BP 06/15/17 1706 (!) 114/48     Pulse Rate 06/15/17 1706 76     Resp --      Temp 06/15/17 1706  98.8 F (37.1 C)     Temp Source 06/15/17 1706 Oral     SpO2 06/15/17 1706 100 %     Weight 06/15/17 1707 196 lb (88.9 kg)     Height 06/15/17 1707  (1.651 m)     Head Circumference --      Peak Flow --      Pain Score --      Pain Loc --      Pain Edu? --      Excl. in GC? --    No data found.   Updated Vital Signs BP (!) 114/48 (BP Location: Left Arm)   Pulse 76   Temp 98.8 F (37.1 C) (Oral)   Ht  (1.651 m)   Wt 196 lb (88.9 kg)   LMP 04/07/2017 (Approximate)   SpO2 100%   BMI 32.62 kg/m   Visual Acuity Right Eye Distance:   Left Eye Distance:   Bilateral Distance:    Right Eye Near:   Left Eye Near:    Bilateral Near:     Physical Exam  Constitutional: She is oriented to person, place, and time. She appears well-developed and well-nourished.  HENT:  Head: Normocephalic and atraumatic.  Right Ear: External ear normal.  Left Ear: External ear normal.  Nose: Nose normal.  Mouth/Throat: Oropharynx is clear and moist.  Eyes: Pupils are equal, round, and reactive to light. Conjunctivae and EOM are normal.  Neck: Normal range of motion.  Cardiovascular: Normal rate, regular rhythm and normal heart sounds.   Pulmonary/Chest: Effort normal.  Musculoskeletal: Normal range of motion. She exhibits no edema or deformity.  Lymphadenopathy:    She has cervical adenopathy.  Neurological: She is alert and oriented to person, place, and time.  Skin: Skin is warm.  Psychiatric: She has a normal mood and affect.  Vitals reviewed.    UC Treatments / Results  Labs (all labs ordered are listed, but only abnormal results are displayed) Labs Reviewed  RAPID STREP SCREEN (NOT AT [facility])  CULTURE, GROUP A STREP [facility])    EKG  EKG Interpretation None       Radiology No results found.  Procedures Procedures (including critical care time)  Medications Ordered in UC Medications - No data to display  Results for orders placed or performed during the  hospital encounter of 06/15/17  Rapid strep screen  Result Value Ref Range   Streptococcus, Group A Screen (Direct) NEGATIVE NEGATIVE   Initial Impression / Assessment and Plan / UC Course  I have reviewed the triage vital signs and the nursing notes.  Pertinent labs & imaging results that were available during my care of the patient were reviewed by me and  considered in my medical decision making (see chart for details).   discussed patient on reluctant to add any antibiotics for a short duration of sore throat especially with the vomiting could aggravate postnasal drainage irritation. Strongly suggest she contact her prenatal PCP for nausea medication. Will recommend Claritin 10 mg 1 tablet day if strep culture is positive or if child's cultures positive then we'll consider treating with antibiotics or this persists for week.  Final Clinical Impressions(s) / UC Diagnoses   Final diagnoses:  Pharyngitis, unspecified etiology    New Prescriptions New Prescriptions   LORATADINE (CLARITIN) 10 MG TABLET    Take 1 tablet (10 mg total) by mouth daily as needed for allergies. Take 1 tablet in the morning. When necessary for congestion   Note: This dictation was prepared with Dragon dictation along with smaller phrase technology. Any transcriptional errors that result from this process are unintentional.  Controlled Substance Prescriptions [place] Controlled Substance Registry consulted? Not Applicable .   Hassan Rowan, MD 06/15/17 Yvonna Alanis    Hassan Rowan, MD 06/15/17 [phone]

## 2017-06-18 LAB — CULTURE, GROUP A STREP (THRC)

## 2017-06-24 ENCOUNTER — Encounter: Payer: Self-pay | Admitting: Emergency Medicine

## 2017-06-24 ENCOUNTER — Emergency Department
Admission: EM | Admit: 2017-06-24 | Discharge: 2017-06-24 | Disposition: A | Discharge status: Home / Self Care | Payer: 59 | Attending: Emergency Medicine | Admitting: Emergency Medicine

## 2017-06-24 ENCOUNTER — Emergency Department: Payer: 59

## 2017-06-24 DIAGNOSIS — Z87891 Personal history of nicotine dependence: Secondary | ICD-10-CM | POA: Diagnosis not present

## 2017-06-24 DIAGNOSIS — O418X11 Other specified disorders of amniotic fluid and membranes, first trimester, fetus 1: Secondary | ICD-10-CM | POA: Insufficient documentation

## 2017-06-24 DIAGNOSIS — Z79899 Other long term (current) drug therapy: Secondary | ICD-10-CM | POA: Insufficient documentation

## 2017-06-24 DIAGNOSIS — O200 Threatened abortion: Secondary | ICD-10-CM

## 2017-06-24 DIAGNOSIS — O209 Hemorrhage in early pregnancy, unspecified: Secondary | ICD-10-CM | POA: Diagnosis present

## 2017-06-24 DIAGNOSIS — Z3A11 11 weeks gestation of pregnancy: Secondary | ICD-10-CM | POA: Diagnosis not present

## 2017-06-24 DIAGNOSIS — O468X1 Other antepartum hemorrhage, first trimester: Secondary | ICD-10-CM

## 2017-06-24 DIAGNOSIS — IMO0001 Reserved for inherently not codable concepts without codable children: Secondary | ICD-10-CM

## 2017-06-24 DIAGNOSIS — O418X10 Other specified disorders of amniotic fluid and membranes, first trimester, not applicable or unspecified: Secondary | ICD-10-CM

## 2017-06-24 LAB — COMPREHENSIVE METABOLIC PANEL
ALK PHOS: 58 U/L (ref 38–126)
ALT: 115 U/L — AB (ref 14–54)
ANION GAP: 8 (ref 5–15)
AST: 110 U/L — ABNORMAL HIGH (ref 15–41)
Albumin: 3.6 g/dL (ref 3.5–5.0)
BUN: 9 mg/dL (ref 6–20)
CALCIUM: 9.1 mg/dL (ref 8.9–10.3)
CO2: 25 mmol/L (ref 22–32)
CREATININE: 0.6 mg/dL (ref 0.44–1.00)
Chloride: 104 mmol/L (ref 101–111)
Glucose, Bld: 84 mg/dL (ref 65–99)
Potassium: 3.8 mmol/L (ref 3.5–5.1)
SODIUM: 137 mmol/L (ref 135–145)
TOTAL PROTEIN: 6.8 g/dL (ref 6.5–8.1)
Total Bilirubin: 0.3 mg/dL (ref 0.3–1.2)

## 2017-06-24 LAB — CBC
HEMATOCRIT: 37.5 % (ref 35.0–47.0)
HEMOGLOBIN: 13.3 g/dL (ref 12.0–16.0)
MCH: 32.8 pg (ref 26.0–34.0)
MCHC: 35.4 g/dL (ref 32.0–36.0)
MCV: 92.6 fL (ref 80.0–100.0)
Platelets: 290 10*3/uL (ref 150–440)
RBC: 4.05 MIL/uL (ref 3.80–5.20)
RDW: 13.4 % (ref 11.5–14.5)
WBC: 12.9 10*3/uL — ABNORMAL HIGH (ref 3.6–11.0)

## 2017-06-24 LAB — ANTIBODY SCREEN: ANTIBODY SCREEN: NEGATIVE

## 2017-06-24 LAB — ABO/RH: ABO/RH(D): O NEG

## 2017-06-24 LAB — HCG, QUANTITATIVE, PREGNANCY: hCG, Beta Chain, Quant, S: 52509 m[IU]/mL — ABNORMAL HIGH (ref <5)

## 2017-06-24 MED ORDER — RHO D IMMUNE GLOBULIN 1500 UNIT/2ML IJ SOSY
300.0000 ug | PREFILLED_SYRINGE | Freq: Once | INTRAMUSCULAR | Status: AC
  Administered 2017-06-24: 300 ug via INTRAMUSCULAR
  Filled 2017-06-24: qty 2

## 2017-06-24 NOTE — ED Triage Notes (Signed)
Pt ambulatory to triage with steady gait, no distress noted. Pt reports she is approximately [redacted] weeks gestation and 1 hour ago started to have heavy vaginal bleeding. Pt denies clots.

## 2017-06-24 NOTE — ED Notes (Signed)
Called blood band about rho immune globulin shot. They stated they need an order for antibody screen and then they will call this RN to come pick up the IM shot when it is ready.

## 2017-06-24 NOTE — ED Notes (Signed)
Pt in US

## 2017-06-24 NOTE — ED Provider Notes (Signed)
[facility] Emergency Department Provider Note  Time seen: 9:32 PM  I have reviewed the triage vital signs and the nursing notes.   HISTORY  Chief Complaint Vaginal Bleeding    HPI Kimberly Neal is a 31 y.o. female G3 P2 approximately [redacted] weeks pregnant who presents the emergency department for vaginal bleeding. According to the patient around 7 PM tonight she fell vaginal leakage, went to the restroom and noted blood. States she has developed some mild abdominal cramping since that time. Now just mild spotting. Patient has been seen by OB/GYN at [facility]. had an ultrasound performed at 8 weeks showing a normal intrauterine pregnancy.  patient states very mild lower abdominal cramping at this time.  Past Medical History:  Diagnosis Date  . Smoker   . UTI (urinary tract infection)     Patient Active Problem List   Diagnosis Date Noted  . Degenerative disc disease, lumbar 06/20/2015    Past Surgical History:  Procedure Laterality Date  . TONSILLECTOMY AND ADENOIDECTOMY      Prior to Admission medications   Medication Sig Start Date End Date Taking? Authorizing Provider  aluminum chloride (DRYSOL) 20 % external solution Apply topically at bedtime. 11/21/16   Trey Sailors, PA-C  baclofen (LIORESAL) 10 MG tablet Take 0.5-1 tablets (5-10 mg total) by mouth 3 (three) times daily. 03/03/17   Galen Manila, NP  cyclobenzaprine (FLEXERIL) 10 MG tablet 1/2-1 tablet up to three times a day for muscle spasm. 11/21/16   Trey Sailors, PA-C  ibuprofen (ADVIL,MOTRIN) 600 MG tablet Take 600 mg by mouth every 6 (six) hours as needed.    [provider]  loratadine (CLARITIN) 10 MG tablet Take 1 tablet (10 mg total) by mouth daily as needed for allergies. Take 1 tablet in the morning. When necessary for congestion 06/15/17   Hassan Rowan, MD  meloxicam (MOBIC) 7.5 MG tablet Take 1 tablet (7.5 mg total) by mouth daily. Needs an appointment with Dr.  Althea Charon before getting more refills of this. 03/03/17   Galen Manila, NP  Prenatal Vit-Fe Fumarate-FA (PRENATAL MULTIVITAMIN) TABS tablet Take 1 tablet by mouth daily at 12 noon.    [provider]    No Known Allergies  Family History  Problem Relation Age of Onset  . COPD Father     Social History Social History  Substance Use Topics  . Smoking status: Former Smoker    Packs/day: 0.50    Types: Cigarettes  . Smokeless tobacco: Never Used  . Alcohol use Yes    Review of Systems Constitutional: Negative for fever. Cardiovascular: Negative for chest pain. Respiratory: Negative for shortness of breath. Gastrointestinal: lower abdominal cramping Genitourinary: vaginal bleeding/spotting Musculoskeletal: Negative for back pain. Neurological: Negative for headache All other ROS negative  ____________________________________________   PHYSICAL EXAM:  VITAL SIGNS: ED Triage Vitals  Enc Vitals Group     BP 06/24/17 1955 135/71     Pulse Rate 06/24/17 1955 95     Resp 06/24/17 1955 16     Temp 06/24/17 1955 98 F (36.7 C)     Temp Source 06/24/17 1955 Oral     SpO2 06/24/17 1955 100 %     Weight 06/24/17 1953 196 lb (88.9 kg)     Height --      Head Circumference --      Peak Flow --      Pain Score --      Pain Loc --  Pain Edu? --      Excl. in GC? --     Constitutional: Alert and oriented. Well appearing and in no distress. Eyes: Normal exam ENT   Head: Normocephalic and atraumatic.   Mouth/Throat: Mucous membranes are moist. Cardiovascular: Normal rate, regular rhythm. No murmur Respiratory: Normal respiratory effort without tachypnea nor retractions. Breath sounds are clear Gastrointestinal: slight diffuse tenderness, no focal tenderness identified. No rebound or guarding. No distention. Musculoskeletal: Nontender with normal range of motion in all extremities. Neurologic:  Normal speech and language. No gross focal neurologic  deficits  Skin:  Skin is warm, dry and intact.  Psychiatric: Mood and affect are normal.   ____________________________________________   RADIOLOGY  IMPRESSION: 1. Single live intrauterine pregnancy estimated gestational age [redacted] weeks 0 days for estimated date of delivery 01/13/2018. 2. Moderate subchorionic hemorrhage. The gestational sac may have shifted lower in the endometrial canal during the course of the exam. Recommend close obstetric follow-up and short-term sonographic follow-up.  ____________________________________________   INITIAL IMPRESSION / ASSESSMENT AND PLAN / ED COURSE  Pertinent labs & imaging results that were available during my care of the patient were reviewed by me and considered in my medical decision making (see chart for details).  patient presents to the emergency department for vaginal bleeding approximately [redacted] weeks pregnant. Differential this time include miscarriage/threatened miscarriage, subchorionic hemorrhage. Patient's labs are largely within normal limits. Patient is O- we will dose RhoGAM. Patient's ultrasound shows a single live intrauterine pregnancy with a heart rate of 165 bpm. However they do mention a moderate subchorionic hemorrhage which would explain the patient's symptoms. They also however mentioned possible shift of gestational sac during the course of the exam. We will have the patient follow up with her OB/GYN tomorrow for recheck/reevaluation tomorrow or Friday. I discussed return precautions with the patient. She is agreeable to this plan.  ____________________________________________   FINAL CLINICAL IMPRESSION(S) / ED DIAGNOSES  threatened miscarriage    Minna Antis, MD 06/24/17 2137

## 2017-06-24 NOTE — ED Notes (Signed)
Pt ambulatory to toilet

## 2017-06-24 NOTE — Discharge Instructions (Signed)
please follow-up to OB/GYN for recheck within the next 1-2 days as well as repeat ultrasound over the next 1-2 days for recheck/reevaluation. your ultrasound report has been attach below. Please bring this to your OB/GYN. Return to the emergency department for any significant increased abdominal pain, or any other symptom personally concerning to yourself.  EXAM:  OBSTETRIC <14 WK ULTRASOUND     TECHNIQUE:  Transabdominal ultrasound was performed for evaluation of the  gestation as well as the maternal uterus and adnexal regions.     COMPARISON:  Report from early obstetric ultrasound at an outside  institution 06/05/2017, images not available     FINDINGS:  Intrauterine gestational sac: Single     Yolk sac:  Visualized.     Embryo:  Visualized.     Cardiac Activity: Visualized.     Heart Rate: 165 bpm     CRL:   42  mm   11 w 0 d                  [place] EDC: 01/13/2018     Subchorionic hemorrhage: Moderate complex subchorionic hemorrhage  measuring 3.1 x 4.3 x 2.0 cm.     Maternal uterus/adnexae: Neither ovary is visualized, no adnexal  mass. There is no pelvic free fluid. During the course of the exam  the gestational sac may have shifted lower in the endometrial canal.     IMPRESSION:  1. Single live intrauterine pregnancy estimated gestational age [redacted]  weeks 0 days for estimated date of delivery 01/13/2018.  2. Moderate subchorionic hemorrhage. The gestational sac may have  shifted lower in the endometrial canal during the course of the  exam. Recommend close obstetric follow-up and short-term sonographic  follow-up.

## 2017-06-25 LAB — RHOGAM INJECTION: Unit division: 0

## 2017-09-23 DIAGNOSIS — R87810 Cervical high risk human papillomavirus (HPV) DNA test positive: Secondary | ICD-10-CM | POA: Insufficient documentation

## 2017-09-23 DIAGNOSIS — R87610 Atypical squamous cells of undetermined significance on cytologic smear of cervix (ASC-US): Secondary | ICD-10-CM | POA: Insufficient documentation

## 2018-01-10 HISTORY — PX: TUBAL LIGATION: SHX77

## 2018-03-29 ENCOUNTER — Ambulatory Visit (INDEPENDENT_AMBULATORY_CARE_PROVIDER_SITE_OTHER): Payer: Managed Care, Other (non HMO)

## 2018-03-29 ENCOUNTER — Ambulatory Visit
Admission: EM | Admit: 2018-03-29 | Discharge: 2018-03-29 | Disposition: A | Discharge status: Home / Self Care | Payer: Managed Care, Other (non HMO) | Attending: Family Medicine | Admitting: Family Medicine

## 2018-03-29 ENCOUNTER — Encounter: Payer: Self-pay | Admitting: Emergency Medicine

## 2018-03-29 ENCOUNTER — Other Ambulatory Visit: Payer: Self-pay

## 2018-03-29 DIAGNOSIS — S90122A Contusion of left lesser toe(s) without damage to nail, initial encounter: Secondary | ICD-10-CM | POA: Diagnosis not present

## 2018-03-29 NOTE — ED Provider Notes (Signed)
[facility]    CSN: [id number] Arrival date & time: 03/29/18  1420     History   Chief Complaint Chief Complaint  Patient presents with  . Foot Pain    left    HPI Kimberly Neal is a 32 y.o. female.   32 yo female presents with a c/o left foot 5th toe pain after hitting/stubbing it against a landscaping timber 2 days ago.   The history is provided by the patient.  Foot Pain     Past Medical History:  Diagnosis Date  . Smoker   . UTI (urinary tract infection)     Patient Active Problem List   Diagnosis Date Noted  . Degenerative disc disease, lumbar 06/20/2015    Past Surgical History:  Procedure Laterality Date  . TONSILLECTOMY AND ADENOIDECTOMY      OB History    Gravida  1   Para      Term      Preterm      AB      Living        SAB      TAB      Ectopic      Multiple      Live Births               Home Medications    Prior to Admission medications   Medication Sig Start Date End Date Taking? Authorizing Provider  ibuprofen (ADVIL,MOTRIN) 600 MG tablet Take 600 mg by mouth every 6 (six) hours as needed.   Yes [provider]  loratadine (CLARITIN) 10 MG tablet Take 1 tablet (10 mg total) by mouth daily as needed for allergies. Take 1 tablet in the morning. When necessary for congestion 06/15/17  Yes Hassan Rowan, MD  aluminum chloride (DRYSOL) 20 % external solution Apply topically at bedtime. 11/21/16   Trey Sailors, PA-C  baclofen (LIORESAL) 10 MG tablet Take 0.5-1 tablets (5-10 mg total) by mouth 3 (three) times daily. 03/03/17   Galen Manila, NP  cyclobenzaprine (FLEXERIL) 10 MG tablet 1/2-1 tablet up to three times a day for muscle spasm. 11/21/16   Trey Sailors, PA-C  meloxicam (MOBIC) 7.5 MG tablet Take 1 tablet (7.5 mg total) by mouth daily. Needs an appointment with Dr. Althea Charon before getting more refills of this. 03/03/17   Galen Manila, NP  Prenatal Vit-Fe Fumarate-FA  (PRENATAL MULTIVITAMIN) TABS tablet Take 1 tablet by mouth daily at 12 noon.    [provider]    Family History Family History  Problem Relation Age of Onset  . COPD Father     Social History Social History   Tobacco Use  . Smoking status: Former Smoker    Packs/day: 0.50    Types: Cigarettes  . Smokeless tobacco: Never Used  Substance Use Topics  . Alcohol use: Not Currently  . Drug use: No     Allergies   Patient has no known allergies.   Review of Systems Review of Systems   Physical Exam Triage Vital Signs ED Triage Vitals  Enc Vitals Group     BP 03/29/18 1500 (!) 104/53     Pulse Rate 03/29/18 1500 64     Resp 03/29/18 1500 16     Temp 03/29/18 1500 98.3 F (36.8 C)     Temp Source 03/29/18 1500 Oral     SpO2 03/29/18 1500 100 %     Weight 03/29/18 1458 220 lb (99.8 kg)  Height 03/29/18 1458 5\' 5"  (1.651 m)     Head Circumference --      Peak Flow --      Pain Score 03/29/18 1457 2     Pain Loc --      Pain Edu? --      Excl. in GC? --    No data found.  Updated Vital Signs BP (!) 104/53 (BP Location: Left Arm)   Pulse 64   Temp 98.3 F (36.8 C) (Oral)   Resp 16   Ht 5\' 5"  (1.651 m)   Wt 220 lb (99.8 kg)   LMP 03/08/2018   SpO2 100%   Breastfeeding? Unknown   BMI 36.61 kg/m   Visual Acuity Right Eye Distance:   Left Eye Distance:   Bilateral Distance:    Right Eye Near:   Left Eye Near:    Bilateral Near:     Physical Exam  Constitutional: She appears well-developed and well-nourished. No distress.  Musculoskeletal:       Left foot: There is decreased range of motion, tenderness and bony tenderness. There is normal capillary refill, no crepitus, no deformity and no laceration.  Skin: She is not diaphoretic.  Nursing note and vitals reviewed.    UC Treatments / Results  Labs (all labs ordered are listed, but only abnormal results are displayed) Labs Reviewed - No data to display  EKG None  Radiology Dg  Toe 5th Left  Result Date: 03/29/2018 CLINICAL DATA:  32 year old female stubbed toe against would. Initial encounter. EXAM: DG TOE 5TH LEFT COMPARISON:  None. FINDINGS: Mild soft tissue prominence adjacent to the left fifth metatarsal phalangeal joint space. Minimal irregularity of the left fifth metatarsal head without fracture noted. Fusion of the left fifth proximal and distal phalanx. IMPRESSION: No fracture of the left fifth toe noted.  Please see above. Electronically Signed   By: Lacy DuverneySteven  Olson M.D.   On: 03/29/2018 16:11    Procedures Procedures (including critical care time)  Medications Ordered in UC Medications - No data to display  Initial Impression / Assessment and Plan / UC Course  I have reviewed the triage vital signs and the nursing notes.  Pertinent labs & imaging results that were available during my care of the patient were reviewed by me and considered in my medical decision making (see chart for details).      Final Clinical Impressions(s) / UC Diagnoses   Final diagnoses:  Contusion of lesser toe of left foot without damage to nail, initial encounter     Discharge Instructions     Ice, over the counter advil/motrin     ED Prescriptions    None     1. x-ray results and diagnosis reviewed with patient 2. Recommend supportive treatment as above 3. Follow-up prn if symptoms worsen or don't improve  Controlled Substance Prescriptions [place] Controlled Substance Registry consulted? Not Applicable   Payton Mccallumonty, Chrisean Kloth, MD 03/29/18 [phone]

## 2018-03-29 NOTE — Discharge Instructions (Addendum)
Ice, over the counter advil/motrin

## 2018-03-29 NOTE — ED Triage Notes (Signed)
Pt is here for left foot pain. She reports that she hit it on a landscaping timber and bent it backwards. The pain is located on the pinky side of her foot. Mild swelling and bruising. Pain is working when walking.

## 2018-06-21 IMAGING — DX DG KNEE COMPLETE 4+V*L*
4 series · 4 of 4 positions shown · non-contrast
Comparison: None.

CLINICAL DATA: Status post fall while playing softball, with
abrasion at the left knee. Initial encounter.

EXAM:
LEFT KNEE - COMPLETE 4+ VIEW

[knee ap]
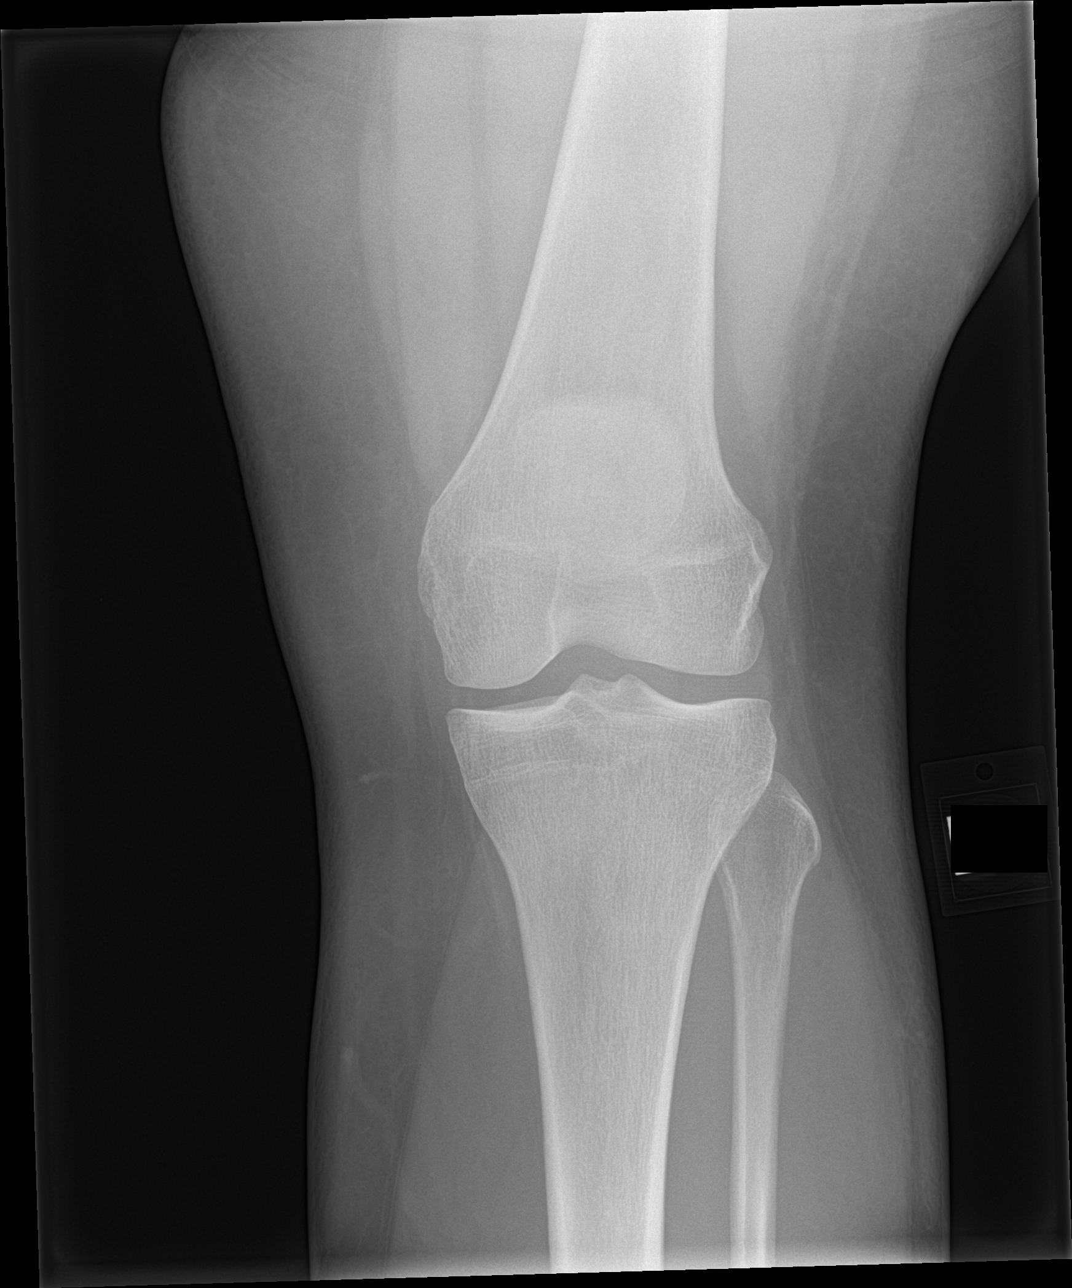

[knee tunnel]
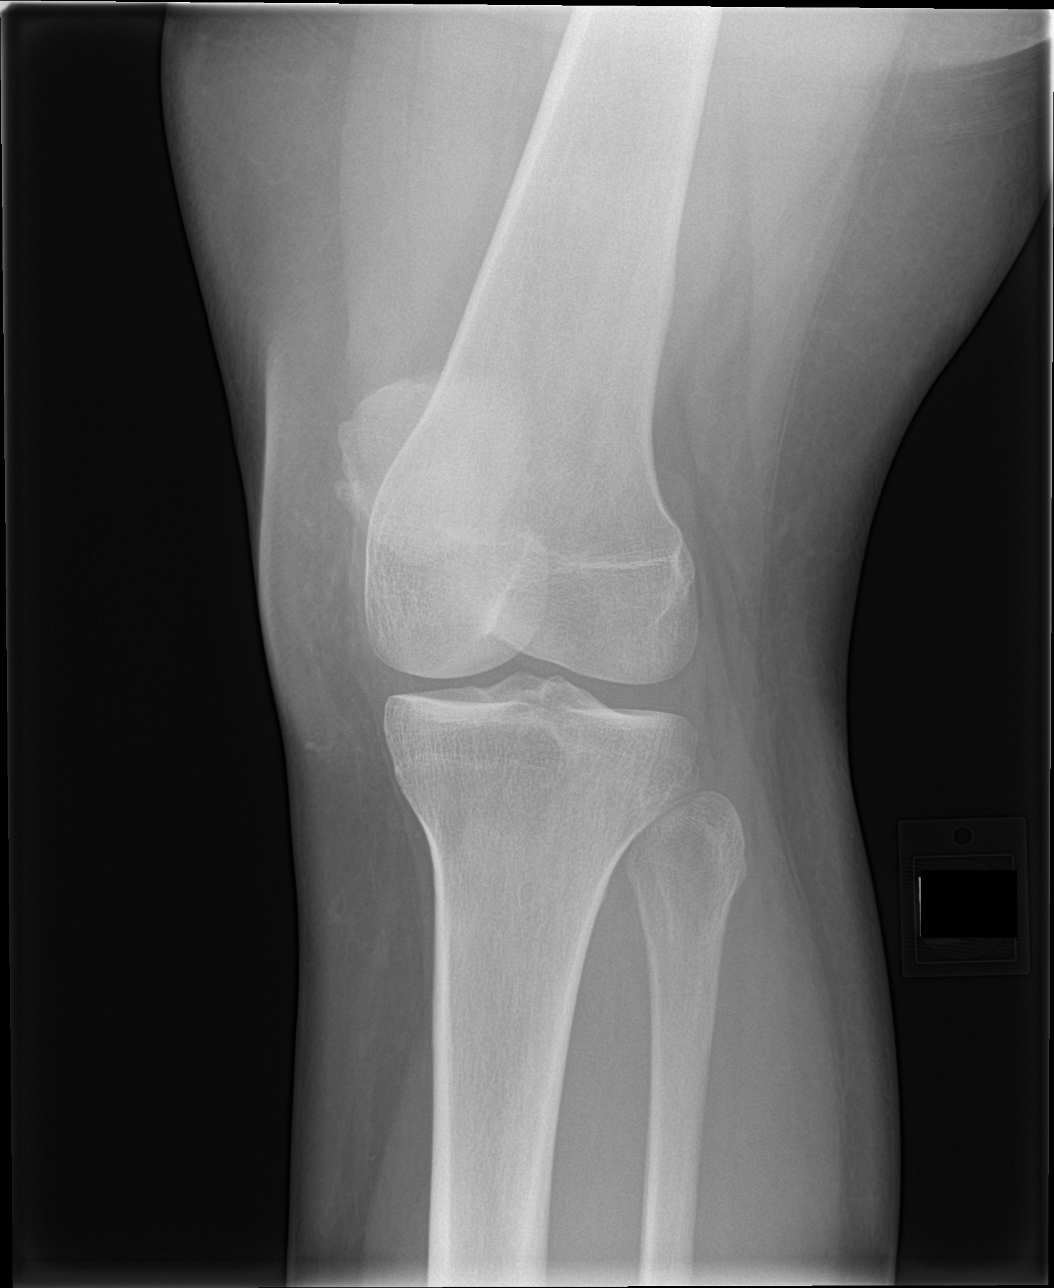

[knee lat (1 of 2)]
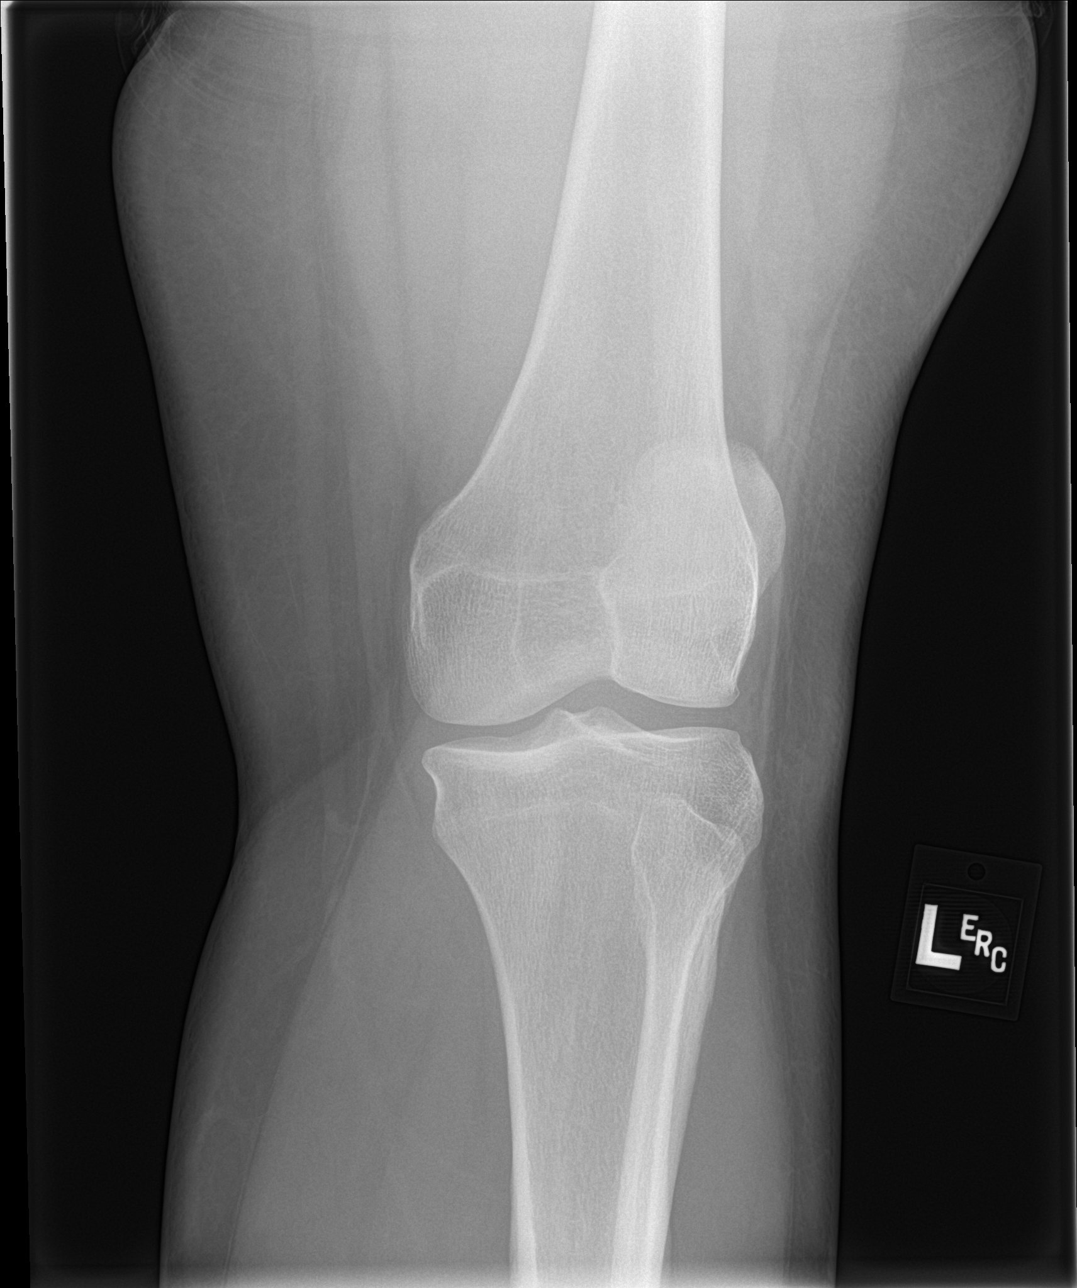

[knee lat (2 of 2)]
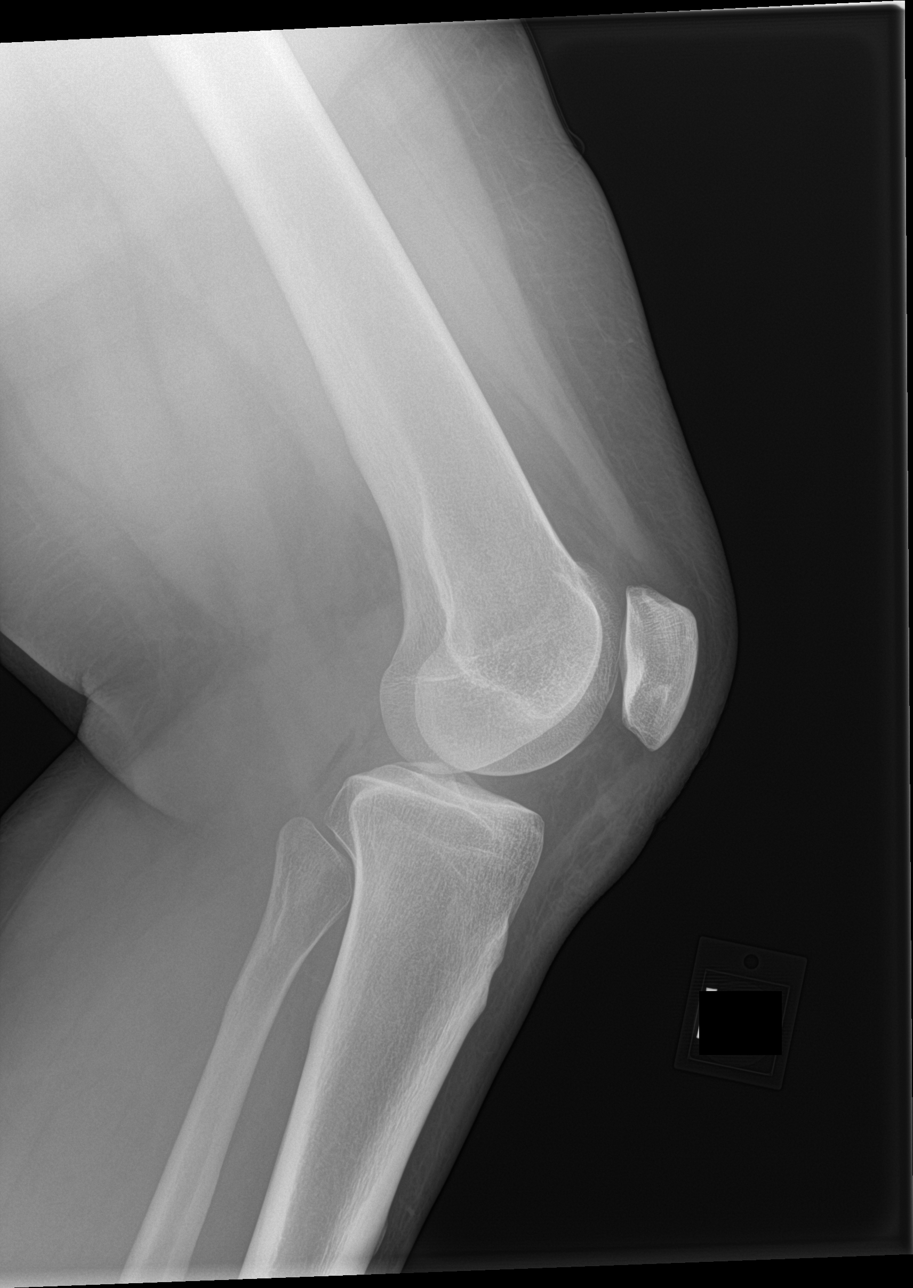

[4 of 4 positions shown; findings below may reference images not displayed]

FINDINGS: There is no evidence of fracture or dislocation. The joint spaces
are preserved. No significant degenerative change is seen; the
patellofemoral joint is grossly unremarkable in appearance.

No significant joint effusion is seen. The visualized soft tissues
are normal in appearance.
IMPRESSION: No evidence of fracture or dislocation.

## 2019-05-24 LAB — HM PAP SMEAR: HM Pap smear: NEGATIVE

## 2019-08-28 IMAGING — US US OB COMP LESS 14 WK
1 series · 13 of 28 positions shown · non-contrast
Comparison: Report from early obstetric ultrasound at an outside
institution 06/05/2017, images not available

CLINICAL DATA: Pregnant patient in first-trimester pregnancy with
vaginal bleeding.

EXAM:
OBSTETRIC <14 WK ULTRASOUND
TECHNIQUE: Transabdominal ultrasound was performed for evaluation of the
gestation as well as the maternal uterus and adnexal regions.

[Series 1: us ob comp less 14 wk · 0.22mm/px · 55 acquisitions, 13 frames shown]
[im 3/55]
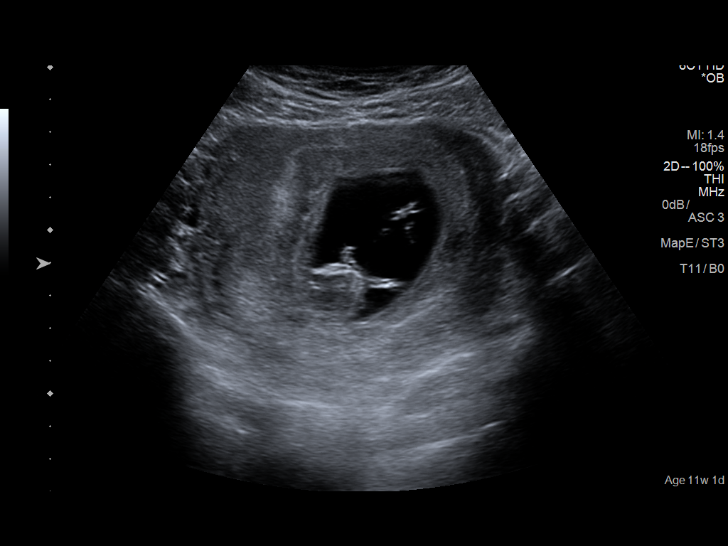
[im 7/55]
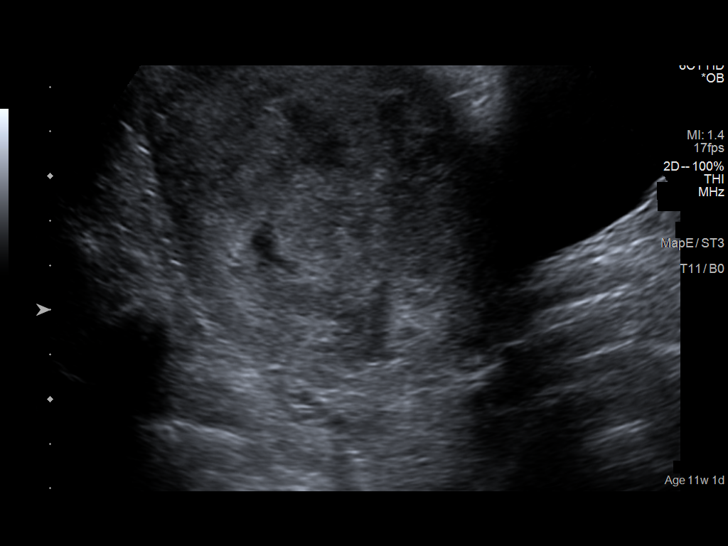
[im 11/55]
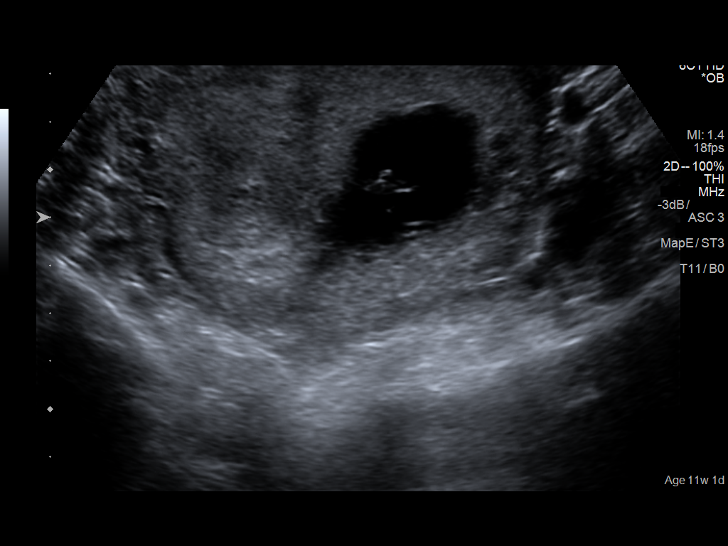
[im 15/55]
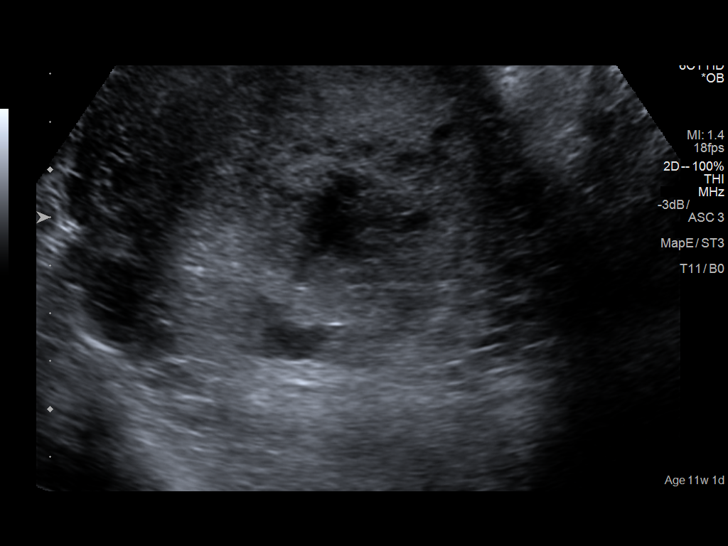
[im 19/55]
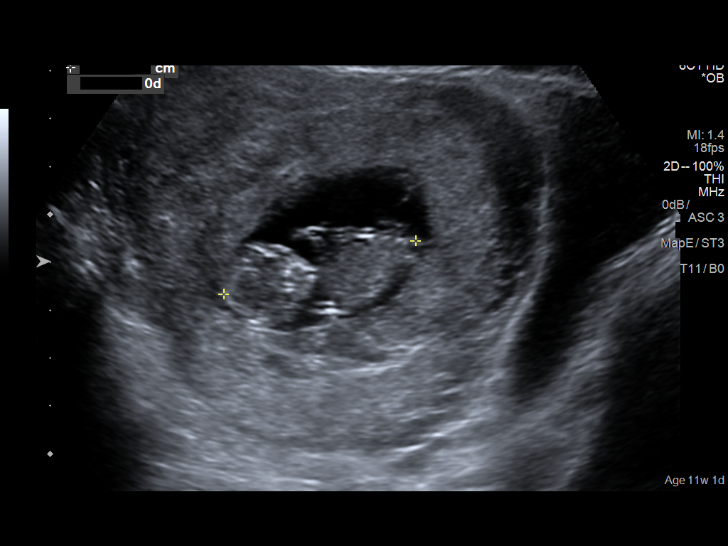
[im 23/55]
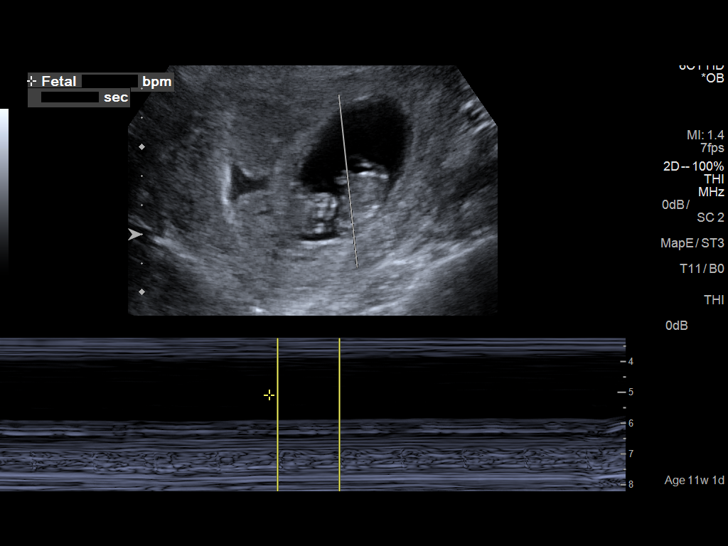
[im 29/55]
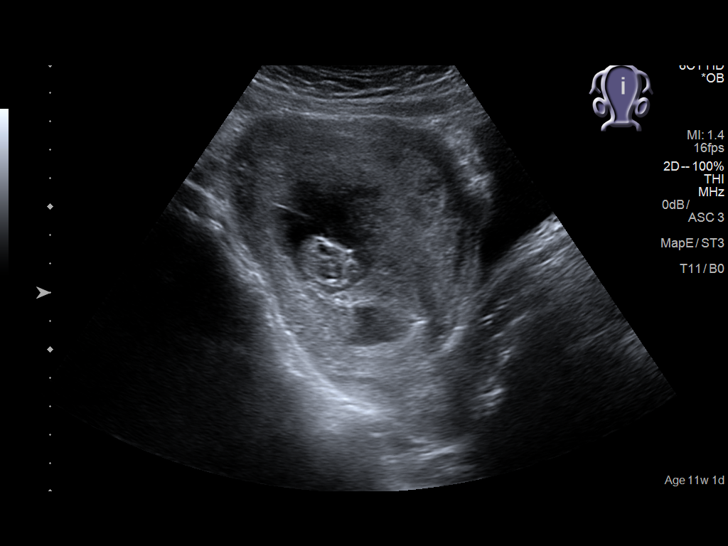
[im 33/55]
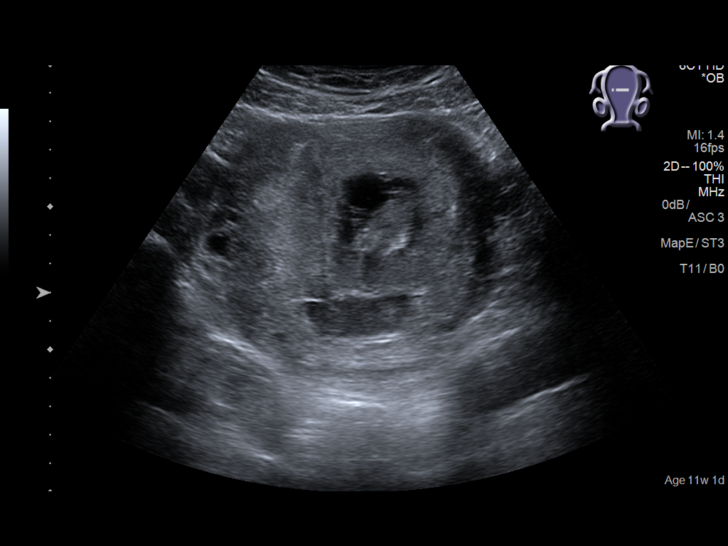
[im 37/55]
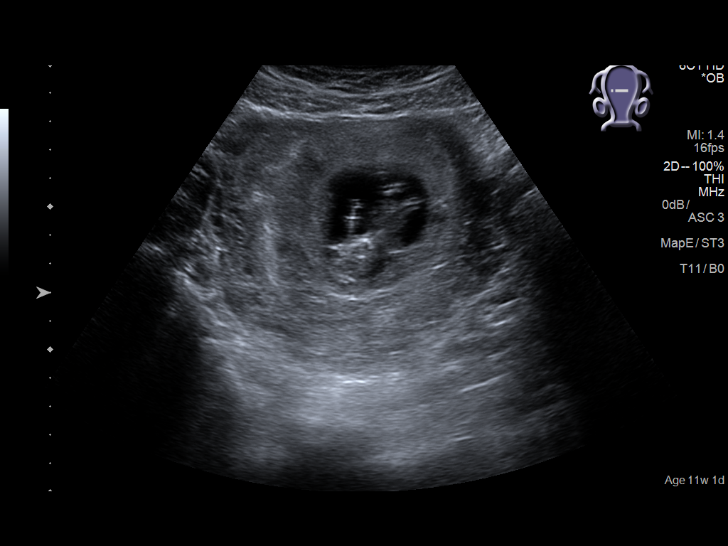
[im 41/55]
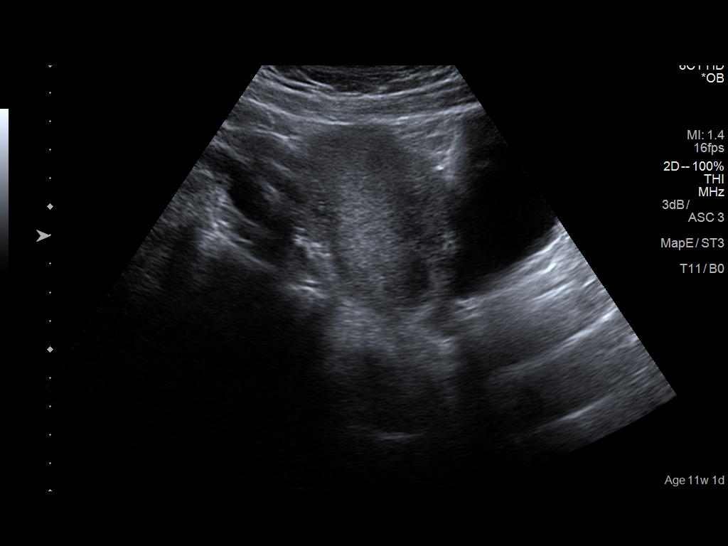
[im 45/55]
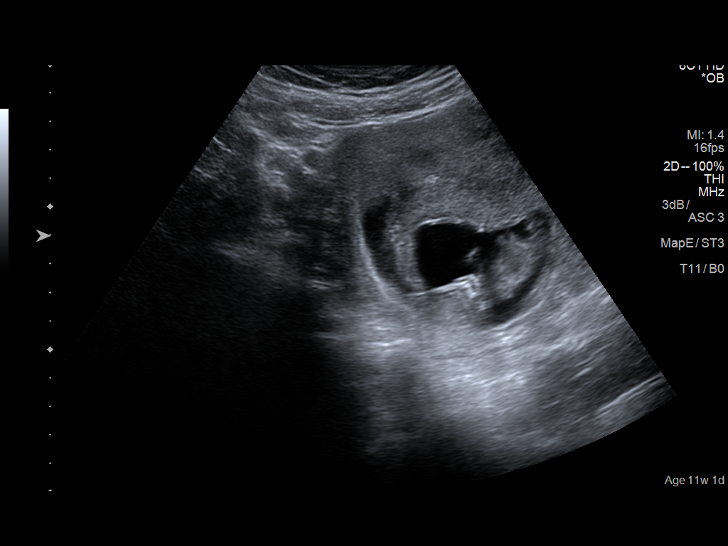
[im 49/55]
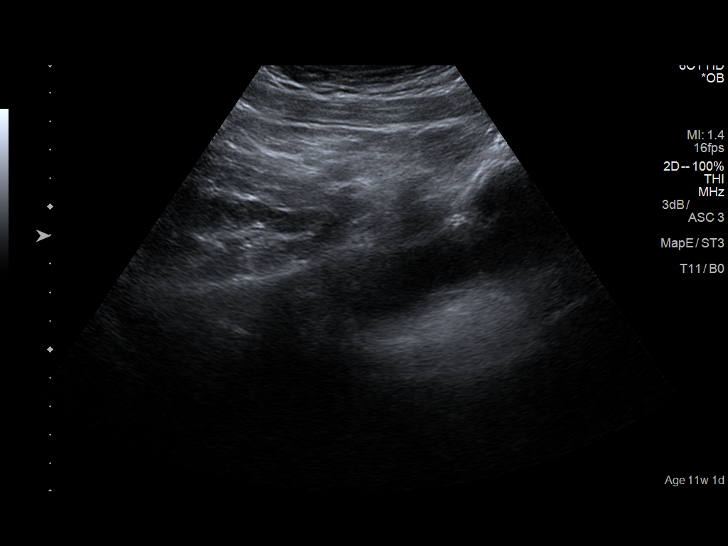
[im 53/55]
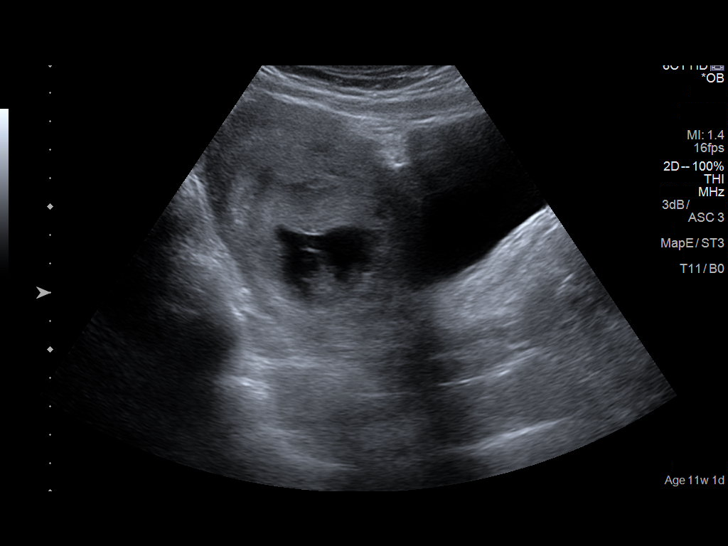

[13 of 28 positions shown; findings below may reference images not displayed]

FINDINGS: Intrauterine gestational sac: Single

Yolk sac:  Visualized.

Embryo:  Visualized.

Cardiac Activity: Visualized.

Heart Rate: 165 bpm

CRL:   42  mm   11 w 0 d                  US EDC: 01/13/2018

Subchorionic hemorrhage: Moderate complex subchorionic hemorrhage
measuring 3.1 x 4.3 x 2.0 cm.

Maternal uterus/adnexae: Neither ovary is visualized, no adnexal
mass. There is no pelvic free fluid. During the course of the exam
the gestational sac may have shifted lower in the endometrial canal.
IMPRESSION: 1. Single live intrauterine pregnancy estimated gestational age 11
weeks 0 days for estimated date of delivery 01/13/2018.
2. Moderate subchorionic hemorrhage. The gestational sac may have
shifted lower in the endometrial canal during the course of the
exam. Recommend close obstetric follow-up and short-term sonographic
follow-up.

## 2019-09-08 ENCOUNTER — Telehealth: Payer: Self-pay | Admitting: Family Medicine

## 2019-09-08 DIAGNOSIS — Z0000 Encounter for general adult medical examination without abnormal findings: Secondary | ICD-10-CM

## 2019-09-08 NOTE — Telephone Encounter (Signed)
Ordered labs  Nobie Putnam, [facility] 09/08/2019, 12:54 PM

## 2019-09-12 ENCOUNTER — Other Ambulatory Visit: Payer: Managed Care, Other (non HMO)

## 2019-09-12 DIAGNOSIS — Z0000 Encounter for general adult medical examination without abnormal findings: Secondary | ICD-10-CM

## 2019-09-19 ENCOUNTER — Encounter: Payer: Self-pay | Admitting: Family Medicine

## 2019-09-19 ENCOUNTER — Other Ambulatory Visit: Payer: Self-pay

## 2019-09-19 ENCOUNTER — Ambulatory Visit (INDEPENDENT_AMBULATORY_CARE_PROVIDER_SITE_OTHER): Payer: Self-pay | Admitting: Family Medicine

## 2019-09-19 VITALS — BP 120/73 | HR 70 | Temp 98.7°F | Resp 16 | Ht 65.0 in | Wt 218.0 lb

## 2019-09-19 DIAGNOSIS — R61 Generalized hyperhidrosis: Secondary | ICD-10-CM

## 2019-09-19 DIAGNOSIS — Z0000 Encounter for general adult medical examination without abnormal findings: Secondary | ICD-10-CM

## 2019-09-19 DIAGNOSIS — F411 Generalized anxiety disorder: Secondary | ICD-10-CM

## 2019-09-19 MED ORDER — ALUMINUM CHLORIDE 20 % EX SOLN: Freq: Every day | CUTANEOUS | 11 refills | Status: DC

## 2019-09-19 MED ORDER — HYDROXYZINE HCL 25 MG PO TABS: 12.5000 mg | ORAL_TABLET | Freq: Every evening | ORAL | 0 refills | Status: DC | PRN

## 2019-09-19 MED ORDER — ESCITALOPRAM OXALATE 10 MG PO TABS: 10.0000 mg | ORAL_TABLET | Freq: Every day | ORAL | 2 refills | Status: DC

## 2019-09-19 NOTE — Patient Instructions (Addendum)
Thank you for coming to the office today.  As discussed, it sounds like your symptoms are primarily related to anxiety / adjustment disorder. This is a very common problem and be related to several factors, including life stressors. Start treatment with Escitalopram, take 10mg  daily for now.  As discussed most anxiety medications are also used for mood disorders such as depression, because they work on similar chemicals in your brain. It may take up to 3-4 weeks for the medicine to take full effect and for you to notice a difference, sometimes you may notice it working sooner, otherwise we may need to adjust the dose.  Take Hydroxyzine as needed for acute anxiety panic or insomnia, caution sedation  It is safe to take Melatonin   Sleep Hygiene Recommendations to promote healthy sleep in all patients, especially if symptoms of insomnia are worsening. Due to the nature of sleep rhythms, if your body gets "out of rhythm", it may take some time before your sleep cycle can be "reset".  Please try to follow as many of the following tips as you can, usually there are only a few of these are the primary cause of the problem.  ?To reset your sleep rhythm, go to bed and get up at the same time every day ?Sleep only long enough to feel rested and then get out of bed ?Do not try to force yourself to sleep. If you can't sleep, get out of bed and try again later. ?Avoid naps during the day, unless excessively tired. The more sleeping during the day, then the less sleep your body needs at night.  ?Have coffee, tea, and other foods that have caffeine only in the morning ?Exercise several days a week, but not right before bed ?If you drink alcohol, prefer to have appropriate drink with one meal, but prefer to avoid alcohol in the evening, and bedtime ?If you smoke, avoid smoking, especially in the evening  ?Avoid watching TV or looking at phones, computers, or reading devices ("e-books") that give off light  at least 30 minutes before bed. This artificial light sends "awake signals" to your brain and can make it harder to fall asleep. ?Make your bedroom a comfortable place where it is easy to fall asleep: ? Put up shades or special blackout curtains to block light from outside. ? Use a white noise machine to block noise. ? Keep the temperature cool. ?Try your best to solve or at least address your problems before you go to bed ?Use relaxation techniques to manage stress. Ask your health care provider to suggest some techniques that may work well for you. These may include: ? Breathing exercises. ? Routines to release muscle tension. ? Visualizing peaceful scenes.   Let us know which therapist to refer you to if you need   ------------------------------- Psychiatry office - also have therapy  [place] [facility] [facility]) Address: [address], [place], [place] [address] Hours: 8:30AM-5PM Phone: [phone]  Cephus Shelling, MD [place] [place], [place] [address] Phone: [phone]   [facility] (All ages) [address], [place], [id number] Phone: [phone] (Option 1) [email or web address]  ----------------------------------------------------------------- PSYCHOLOGY COUNSELING ONLY  [facility]  ([facility] - preferred option)  Buena Irish, LCSW [address], [place]: [phone]   Self Referral:  1. [facility] [place].   Address: [address]  [address], [place], [place] [address] Hours: Open today  9AM-7PM Phone: [phone]  2. Anell Barr [employer]  - [facility] Address: [address] [place], [place] [address] Phone: [phone]  ---------------------------------------------------------------------------------------   Please schedule a Follow-up  Appointment to: Return in about 3 months (around 12/18/2019) for anxiety.  If you have any other questions or concerns, please feel free to call the office or send a message through MyChart. You may also schedule an earlier appointment if necessary.  Additionally, you may be receiving a survey about your experience at our office within a few days to 1 week by e-mail or mail. We value your feedback.  Saralyn Pilar, DO [facility], [place]

## 2019-09-19 NOTE — Progress Notes (Signed)
Subjective:    Patient ID: Kimberly Mourningahia D Wortley, female    DOB: 04-04-1986, 33 y.o.   MRN: [id number]  Kimberly Neal is a 33 y.o. female presenting on 09/19/2019 for Annual Exam   HPI   Wellness / Obesity BMI >36 Goal to lose weight, primarily through diet and exercise. She admits sweet tooth and plans to limit extra sweets, and goal to find time to exercise regularly.  She will get labs tomorrow  Additional concern:  Anxiety She has always had some baseline anxiety She was working mostly part-time in past. She is anticipating starting full-time work. She describes feeling overwhelmed with family and work situation. She does not admit depression. Describes more physical symptoms manifestation of anxiety and concern now may warrant treatment. Never on med for it before, nor therapist.  Hyperhidrosis Chronic problem bilateral axillary with excessive sweating. Used aluminium topical anti per spirant in past with some relief, request refill  Health Maintenance: Due for Flu Shot, declines today despite counseling on benefits   [facility] 09/19/2019 11/21/2016 10/29/2015  Decreased Interest 0 0 0  Down, Depressed, Hopeless 0 0 0  PHQ - 2 Score 0 0 0   GAD 7 : Generalized Anxiety Score 09/19/2019  Nervous, Anxious, on Edge 3  Control/stop worrying 2  Worry too much - different things 2  Trouble relaxing 3  Restless 2  Easily annoyed or irritable 3  Afraid - awful might happen 3  Total GAD 7 Score 18  Anxiety Difficulty Very difficult      Past Medical History:  Diagnosis Date  . Smoker   . UTI (urinary tract infection)    Past Surgical History:  Procedure Laterality Date  . TONSILLECTOMY AND ADENOIDECTOMY     Social History   Socioeconomic History  . Marital status: Married    Spouse name: Not on file  . Number of children: Not on file  . Years of education: Not on file  . Highest education level: Not on file  Occupational History  . Not on  file  Tobacco Use  . Smoking status: Former Smoker    Packs/day: 0.50    Types: Cigarettes  . Smokeless tobacco: Never Used  Substance and Sexual Activity  . Alcohol use: Not Currently  . Drug use: No  . Sexual activity: Not on file  Other Topics Concern  . Not on file  Social History Narrative  . Not on file   Social Determinants of Health   Financial Resource Strain:   . Difficulty of Paying Living Expenses: Not on file  Food Insecurity:   . Worried About Programme researcher, broadcasting/film/videounning Out of Food in the Last Year: Not on file  . Ran Out of Food in the Last Year: Not on file  Transportation Needs:   . Lack of Transportation (Medical): Not on file  . Lack of Transportation (Non-Medical): Not on file  Physical Activity:   . Days of Exercise per Week: Not on file  . Minutes of Exercise per Session: Not on file  Stress:   . Feeling of Stress : Not on file  Social Connections:   . Frequency of Communication with Friends and Family: Not on file  . Frequency of Social Gatherings with Friends and Family: Not on file  . Attends Religious Services: Not on file  . Active Member of Clubs or Organizations: Not on file  . Attends BankerClub or Organization Meetings: Not on file  . Marital Status: Not on file  Intimate  Partner Violence:   . Fear of Current or Ex-Partner: Not on file  . Emotionally Abused: Not on file  . Physically Abused: Not on file  . Sexually Abused: Not on file   Family History  Problem Relation Age of Onset  . COPD Father    Current Outpatient Medications on File Prior to Visit  Medication Sig  . loratadine (CLARITIN) 10 MG tablet Take 1 tablet (10 mg total) by mouth daily as needed for allergies. Take 1 tablet in the morning. When necessary for congestion  . Prenatal Vit-Fe Fumarate-FA (PRENATAL MULTIVITAMIN) TABS tablet Take 1 tablet by mouth daily at 12 noon.   No current facility-administered medications on file prior to visit.    Review of Systems Per HPI unless specifically  indicated above      Objective:    BP 120/73   Pulse 70   Temp 98.7 F (37.1 C) (Oral)   Resp 16   Ht 5\' 5"  (1.651 m)   Wt 218 lb (98.9 kg)   BMI 36.28 kg/m   Wt Readings from Last 3 Encounters:  09/19/19 218 lb (98.9 kg)  03/29/18 220 lb (99.8 kg)  06/24/17 196 lb (88.9 kg)    Physical Exam Vitals and nursing note reviewed.  Constitutional:      General: She is not in acute distress.    Appearance: She is well-developed. She is not diaphoretic.     Comments: Well-appearing, comfortable, cooperative  HENT:     Head: Normocephalic and atraumatic.  Eyes:     General:        Right eye: No discharge.        Left eye: No discharge.     Conjunctiva/sclera: Conjunctivae normal.     Pupils: Pupils are equal, round, and reactive to light.  Neck:     Thyroid: No thyromegaly.  Cardiovascular:     Rate and Rhythm: Normal rate and regular rhythm.     Heart sounds: Normal heart sounds. No murmur.  Pulmonary:     Effort: Pulmonary effort is normal. No respiratory distress.     Breath sounds: Normal breath sounds. No wheezing or rales.  Abdominal:     General: Bowel sounds are normal. There is no distension.     Palpations: Abdomen is soft. There is no mass.     Tenderness: There is no abdominal tenderness.  Musculoskeletal:        General: No tenderness. Normal range of motion.     Cervical back: Normal range of motion and neck supple.     Comments: Upper / Lower Extremities: - Normal muscle tone, strength bilateral upper extremities 5/5, lower extremities 5/5  Lymphadenopathy:     Cervical: No cervical adenopathy.  Skin:    General: Skin is warm and dry.     Findings: No erythema or rash.  Neurological:     Mental Status: She is alert and oriented to person, place, and time.     Comments: Distal sensation intact to light touch all extremities  Psychiatric:        Behavior: Behavior normal.     Comments: Well groomed, good eye contact, normal speech and thoughts       Care Everywhere Result Report High Risk Human Papilloma Virus (HPV) With GenotypingResulted: 05/24/2019 5:34 AM [facility] Component Name Value Ref Range  High Risk HPV Positive (Detected) (A) Not Detected   High-Risk HPV (NON 16/18) Positive (Detected) (A) Not Detected, N/A   HPV Type 16 Not  Detected Not Detected, N/A   HPV Type 18 Not Detected Not Detected, N/A   Specimen Collected on  Genital - Cervix uteri structure (body structure) 05/17/2019 9:49 AM  Result Narrative  The cobas HPV Test is a FDA-approved qualitative in vitro test for the detection of Human Papillomavirus in a clinician collected cervical specimen using an endocervical brush/spatula and placed in the ThinPrep Pap Test PreservCyt Solution. The test utilizes amplification of target DNA by the Polymerase Chain Reaction (PCR) and nucleic acid hybridization for the detection of 14 high-risk (HR) HPV types in a single analysis. The test specifically identifies types HPV16 and HPV18 while concurrently detecting the rest of the high risk types (31, 33, 35, 39, 45, 51, 52, 56, 58, 59, 66 and 68). For a summary of the performance characteristics of this test, please refer to the FDA product insert [email or web address]     Care Everywhere Result Report Pathology - GUResulted: 08/11/2019 3:16 PM [facility] Component Name Value Ref Range  Case Report Surgical Pathology                Case: [id number]                 Authorizing Provider: Jeanella Flattery, MD  Collected:      08/09/2019 1405       Ordering Location:   [facility] Obstetrics and   Received:      08/10/2019 [phone]                   Gynecology                                  Pathologist:      Rocky Link, MD                          Specimens:   A) - Cervix, Biopsy, 6 oclock                                    B) - Cervix, Biopsy, ecc                                  DIAGNOSIS A.  Cervix, 6:00, biopsy:  Benign squamous and endocervical epithelium. No dysplasia or carcinoma is seen.  B.  Endocervix, curettage:  Benign endocervical and endometrial epithelium in a background of mucin. No dysplasia, endometrial intraepithelial neoplasia (EIN), or carcinoma identified.   Clinical Information HPV (human papilloma virus) anogenital infection    Gross Examination A. "Cervix biopsy 6:00", in formalin.  Received is a 0.3 x 0.2 x 0.2 cm tan tissue fragment which is submitted in toto in A1.   B. "ECC", in formalin.  Received is a 1.5 x 1.2 x 0.3 cm aggregate of mucoid material and brown tissue which is submitted in toto in B1.  FS:ptw   Microscopic Examination Microscopic examination is performed.   Disclaimer All immunohistochemistry, in situ hybridization tests and special stains performed at [facility] and reported herein were developed, validated and their performance characteristics determined by the [facility]. During the performance of these tests, appropriate positive and negative control slides are also performed and reviewed. All control slides and internal controls (when applicable)  demonstrate the expected immunoreactive patterns and/or nucleic acid hybridization. These ancillary studies were ordered following review of the H&E and clinical history, except when part of a liver/kidney protocol or deemed necessary following review of the clinical history (e.g. immunocompromised, critically ill, history of malignancy). Some of the tests may not be cleared or approved by the U.S. Food and Drug Administration (FDA). The FDA has determined that such clearance or approval is not necessary. These tests are used for clinical purposes and should not  be regarded as investigational or as research. This laboratory is certified under the Clinical Laboratory Improvement Amendments of 1988 (CLIA) as qualified to perform high complexity clinical testing.   Attestation All of the diagnostic evaluations on the enumerated specimens have been personally conducted by the pathologists involved in the care of this patient as indicated by the electronic signatures above.   Specimen Collected on  Tissue-Pathology - Cervix, Biopsy, Tissue specimen (specimen) - Cervix, Biopsy 08/09/2019 2:05 PM      Results for orders placed or performed during the hospital encounter of 06/24/17  hCG, quantitative, pregnancy  Result Value Ref Range   hCG, Beta Chain, Quant, S 52,509 (H) <5 mIU/mL  CBC  Result Value Ref Range   WBC 12.9 (H) 3.6 - 11.0 K/uL   RBC 4.05 3.80 - 5.20 MIL/uL   Hemoglobin 13.3 12.0 - 16.0 g/dL   HCT 57.8 46.9 - 62.9 %   MCV 92.6 80.0 - 100.0 fL   MCH 32.8 26.0 - 34.0 pg   MCHC 35.4 32.0 - 36.0 g/dL   RDW 52.8 41.3 - 24.4 %   Platelets 290 150 - 440 K/uL  Comprehensive metabolic panel  Result Value Ref Range   Sodium 137 135 - 145 mmol/L   Potassium 3.8 3.5 - 5.1 mmol/L   Chloride 104 101 - 111 mmol/L   CO2 25 22 - 32 mmol/L   Glucose, Bld 84 65 - 99 mg/dL   BUN 9 6 - 20 mg/dL   Creatinine, Ser 0.10 0.44 - 1.00 mg/dL   Calcium 9.1 8.9 - 27.2 mg/dL   Total Protein 6.8 6.5 - 8.1 g/dL   Albumin 3.6 3.5 - 5.0 g/dL   AST 536 (H) 15 - 41 U/L   ALT 115 (H) 14 - 54 U/L   Alkaline Phosphatase 58 38 - 126 U/L   Total Bilirubin 0.3 0.3 - 1.2 mg/dL   GFR calc non Af Amer >60 >60 mL/min   GFR calc Af Amer >60 >60 mL/min   Anion gap 8 5 - 15  ABO/Rh  Result Value Ref Range   ABO/RH(D) O NEG   Antibody screen  Result Value Ref Range   Antibody Screen NEG   Rhogam injection  Result Value Ref Range   Unit Number [id number]    Blood Component Type RHIG    Unit division 00    Status of Unit ISSUED,FINAL    Transfusion Status OK TO  TRANSFUSE       Assessment & Plan:   Problem List Items Addressed This Visit    None    Visit Diagnoses    Annual physical exam    -  Primary   GAD (generalized anxiety disorder)       Relevant Medications   escitalopram (LEXAPRO) 10 MG tablet   hydrOXYzine (ATARAX/VISTARIL) 25 MG tablet   Excessive sweating       Refill drysol.    Relevant Medications   aluminum chloride (DRYSOL) 20 % external solution  Updated Health Maintenance information Patient did not have labs drawn, can return for labs Encouraged improvement to lifestyle with diet and exercise Goal of weight loss  #Hyperhydrosis Chronic problem, may be worse from anxiety Refill topical Drysol solution  #Anxiety Chronic problem, clinically seems consistent with mild to moderate GAD without significant panic Some associated insomnia, worse with stressors No prior meds  Start Escitalopram 10mg  daily AM with food, counseling on potential side effects risks, reviewed possible GI intolerance, insomnia (although likely to improve this given anxiety likely source of insomnia), reviewed black box warning inc suicidal (no prior history, unlikely concern) - anticipate 4-6 weeks for notable effect, may need titrate dose to 20 in future Rx Hydroxyzine PRN QHS insomnia Advised recommend therapy / counseling in future - will given handout locations/names if need 4. Follow-up 3 months or sooner if need anxiety, med adjust, GAD7/PHQ9    Meds ordered this encounter  Medications  . escitalopram (LEXAPRO) 10 MG tablet    Sig: Take 1 tablet (10 mg total) by mouth daily.    Dispense:  30 tablet    Refill:  2  . hydrOXYzine (ATARAX/VISTARIL) 25 MG tablet    Sig: Take 0.5-1 tablets (12.5-25 mg total) by mouth at bedtime as needed for anxiety (insomnia).    Dispense:  30 tablet    Refill:  0  . aluminum chloride (DRYSOL) 20 % external solution    Sig: Apply topically at bedtime.    Dispense:  35 mL    Refill:  11      Follow up plan: Return in about 3 months (around 12/18/2019) for anxiety.  Nobie Putnam, DO [facility] 09/19/2019, 1:50 PM

## 2019-09-20 ENCOUNTER — Other Ambulatory Visit: Payer: Managed Care, Other (non HMO)

## 2019-09-21 LAB — COMPLETE METABOLIC PANEL WITH GFR
AG Ratio: 2.1 (calc) (ref 1.0–2.5)
ALT: 8 U/L (ref 6–29)
AST: 13 U/L (ref 10–30)
Albumin: 4.1 g/dL (ref 3.6–5.1)
Alkaline phosphatase (APISO): 38 U/L (ref 31–125)
BUN: 12 mg/dL (ref 7–25)
CO2: 26 mmol/L (ref 20–32)
Calcium: 8.4 mg/dL — ABNORMAL LOW (ref 8.6–10.2)
Chloride: 106 mmol/L (ref 98–110)
Creat: 0.68 mg/dL (ref 0.50–1.10)
GFR, Est African American: 131 mL/min/{1.73_m2} (ref >=60)
GFR, Est Non African American: 113 mL/min/{1.73_m2} (ref >=60)
Globulin: 2 g/dL (calc) (ref 1.9–3.7)
Glucose, Bld: 91 mg/dL (ref 65–99)
Potassium: 4.1 mmol/L (ref 3.5–5.3)
Sodium: 140 mmol/L (ref 135–146)
Total Bilirubin: 0.4 mg/dL (ref 0.2–1.2)
Total Protein: 6.1 g/dL (ref 6.1–8.1)

## 2019-09-21 LAB — CBC WITH DIFFERENTIAL/PLATELET
Absolute Monocytes: 608 cells/uL (ref 200–950)
Basophils Absolute: 40 cells/uL (ref 0–200)
Basophils Relative: 0.5 %
Eosinophils Absolute: 71 cells/uL (ref 15–500)
Eosinophils Relative: 0.9 %
HCT: 37 % (ref 35.0–45.0)
Hemoglobin: 12.6 g/dL (ref 11.7–15.5)
Lymphs Abs: 2963 cells/uL (ref 850–3900)
MCH: 31.9 pg (ref 27.0–33.0)
MCHC: 34.1 g/dL (ref 32.0–36.0)
MCV: 93.7 fL (ref 80.0–100.0)
MPV: 11 fL (ref 7.5–12.5)
Monocytes Relative: 7.7 %
Neutro Abs: 4219 cells/uL (ref 1500–7800)
Neutrophils Relative %: 53.4 %
Platelets: 237 10*3/uL (ref 140–400)
RBC: 3.95 10*6/uL (ref 3.80–5.10)
RDW: 12.4 % (ref 11.0–15.0)
Total Lymphocyte: 37.5 %
WBC: 7.9 10*3/uL (ref 3.8–10.8)

## 2019-09-21 LAB — HEMOGLOBIN A1C
Hgb A1c MFr Bld: 4.9 % of total Hgb (ref <5.7)
Mean Plasma Glucose: 94 (calc)
eAG (mmol/L): 5.2 (calc)

## 2019-09-21 LAB — LIPID PANEL
Cholesterol: 181 mg/dL (ref <200)
HDL: 41 mg/dL — ABNORMAL LOW (ref >=50)
LDL Cholesterol (Calc): 126 mg/dL (calc) — ABNORMAL HIGH
Non-HDL Cholesterol (Calc): 140 mg/dL (calc) — ABNORMAL HIGH (ref <130)
Total CHOL/HDL Ratio: 4.4 (calc) (ref <5.0)
Triglycerides: 53 mg/dL (ref <150)

## 2019-10-09 DIAGNOSIS — F411 Generalized anxiety disorder: Secondary | ICD-10-CM

## 2019-10-10 MED ORDER — ESCITALOPRAM OXALATE 20 MG PO TABS: 20.0000 mg | ORAL_TABLET | Freq: Every day | ORAL | 2 refills | Status: DC

## 2019-12-19 ENCOUNTER — Ambulatory Visit: Payer: Managed Care, Other (non HMO) | Admitting: Family Medicine

## 2019-12-19 ENCOUNTER — Encounter: Payer: Self-pay | Admitting: Family Medicine

## 2020-01-14 ENCOUNTER — Other Ambulatory Visit: Payer: Self-pay | Admitting: Family Medicine

## 2020-01-14 DIAGNOSIS — F411 Generalized anxiety disorder: Secondary | ICD-10-CM

## 2020-01-14 NOTE — Telephone Encounter (Signed)
Requested Prescriptions  Pending Prescriptions Disp Refills  . escitalopram (LEXAPRO) 20 MG tablet [Pharmacy Med Name: ESCITALOPRAM 20 MG TABLET] 90 tablet 0    Sig: TAKE 1 TABLET BY MOUTH EVERY DAY     Psychiatry:  Antidepressants - SSRI Passed - 01/14/2020  7:02 PM      Passed - Valid encounter within last 6 months    Recent Outpatient Visits          3 months ago Annual physical exam   [facility] [place], Netta Neat, DO   2 years ago Acute bilateral low back pain without sciatica   [facility] Galen Manila, NP   3 years ago Back pain, unspecified back location, unspecified back pain laterality, unspecified chronicity   [facility] [place], [place], [place]   4 years ago Degenerative disc disease, lumbar   [facility] [place] Apple, Laurel Dimmer, NP   4 years ago Degenerative disc disease, lumbar   [facility] Janeann Forehand., MD

## 2020-04-07 ENCOUNTER — Other Ambulatory Visit: Payer: Self-pay | Admitting: Family Medicine

## 2020-04-07 DIAGNOSIS — F411 Generalized anxiety disorder: Secondary | ICD-10-CM

## 2020-04-07 NOTE — Telephone Encounter (Signed)
30 day courtesy refill Requested Prescriptions  Pending Prescriptions Disp Refills  . escitalopram (LEXAPRO) 20 MG tablet [Pharmacy Med Name: ESCITALOPRAM 20 MG TABLET] 30 tablet 0    Sig: TAKE 1 TABLET BY MOUTH EVERY DAY     Psychiatry:  Antidepressants - SSRI Failed - 04/07/2020  8:52 AM      Failed - Valid encounter within last 6 months    Recent Outpatient Visits          6 months ago Annual physical exam   [facility] [place], Netta Neat, DO   3 years ago Acute bilateral low back pain without sciatica   [facility] Galen Manila, NP   3 years ago Back pain, unspecified back location, unspecified back pain laterality, unspecified chronicity   [facility] [place], [place] M, [place]   4 years ago Degenerative disc disease, lumbar   [facility] [place] Apple, Laurel Dimmer, NP   4 years ago Degenerative disc disease, lumbar   [facility] Janeann Forehand., MD

## 2020-06-25 ENCOUNTER — Telehealth: Payer: Managed Care, Other (non HMO) | Admitting: Family Medicine

## 2020-06-28 ENCOUNTER — Ambulatory Visit (INDEPENDENT_AMBULATORY_CARE_PROVIDER_SITE_OTHER): Payer: BC Managed Care – PPO | Admitting: Family Medicine

## 2020-06-28 ENCOUNTER — Encounter: Payer: Self-pay | Admitting: Family Medicine

## 2020-06-28 ENCOUNTER — Other Ambulatory Visit: Payer: Self-pay

## 2020-06-28 VITALS — BP 106/63 | HR 69 | Temp 98.5°F | Resp 18 | Ht 65.0 in | Wt 194.6 lb

## 2020-06-28 DIAGNOSIS — M545 Low back pain, unspecified: Secondary | ICD-10-CM

## 2020-06-28 DIAGNOSIS — M5136 Other intervertebral disc degeneration, lumbar region: Secondary | ICD-10-CM

## 2020-06-28 DIAGNOSIS — M51369 Other intervertebral disc degeneration, lumbar region without mention of lumbar back pain or lower extremity pain: Secondary | ICD-10-CM

## 2020-06-28 MED ORDER — CYCLOBENZAPRINE HCL 10 MG PO TABS: 10.0000 mg | ORAL_TABLET | Freq: Three times a day (TID) | ORAL | 0 refills | Status: DC | PRN

## 2020-06-28 MED ORDER — PREDNISONE 50 MG PO TABS: ORAL_TABLET | ORAL | 0 refills | Status: DC

## 2020-06-28 NOTE — Assessment & Plan Note (Signed)
Acute exacerbation of chronic lower back pain.  Will treat with prednisone 50mg  daily x 5 days and cyclobenzaprine 10mg  TID PRN.  Provided AVS with back exercises.    Plan: 1. Begin prednisone 50mg  daily x 5 days 2. Begin cyclobenzaprine 10mg  TID PRN 3. Work on back exercises/stretching 4. RTC if symptoms worsen or fail to improve

## 2020-06-28 NOTE — Progress Notes (Signed)
Subjective:    Patient ID: Kimberly Neal, female    DOB: [birth year], 34 y.o.   MRN: [id number]  Kimberly Neal is a 34 y.o. female presenting on 06/28/2020 for Back Pain (lower right side back pan w/ history chronic back pain. Pt state she haven't had a flare up in awhile, but state when she got out of the bed x 2 days ago she felt a tight feelling in the area. She state the pain now is a dull ache that worsen with different movements.)   HPI  Patient presents to clinic with c/o right lower back pain that flared approximately 2 days ago when she was getting out of bed.  Has a history of chronic lower back pain that required steroid injection and muscle relaxers in the past.  Reports pain is now a dull ache that worsens with certain movements.  Denies numbness, tingling, foot drop, change in bowel/bladder function or saddle anesthesia.   Depression screen [facility] 2/9 09/19/2019 11/21/2016 10/29/2015  Decreased Interest 0 0 0  Down, Depressed, Hopeless 0 0 0  PHQ - 2 Score 0 0 0    Social History   Tobacco Use  . Smoking status: Former Smoker    Packs/day: 0.50    Types: Cigarettes  . Smokeless tobacco: Never Used  Vaping Use  . Vaping Use: Never used  Substance Use Topics  . Alcohol use: Not Currently  . Drug use: No    Review of Systems  Constitutional: Negative.   HENT: Negative.   Eyes: Negative.   Respiratory: Negative.   Cardiovascular: Negative.   Gastrointestinal: Negative.   Endocrine: Negative.   Genitourinary: Negative.   Musculoskeletal: Positive for back pain and myalgias. Negative for arthralgias, gait problem, joint swelling, neck pain and neck stiffness.  Skin: Negative.   Allergic/Immunologic: Negative.   Neurological: Negative.   Hematological: Negative.   Psychiatric/Behavioral: Negative.    Per HPI unless specifically indicated above     Objective:    BP 106/63 (BP Location: Left Arm, Patient Position: Sitting, Cuff Size: Normal)   Pulse 69    Temp 98.5 F (36.9 C) (Oral)   Resp 18   Ht 5\' 5"  (1.651 m)   Wt 194 lb 9.6 oz (88.3 kg)   LMP 06/18/2020   SpO2 100%   BMI 32.38 kg/m   Wt Readings from Last 3 Encounters:  06/28/20 194 lb 9.6 oz (88.3 kg)  09/19/19 218 lb (98.9 kg)  03/29/18 220 lb (99.8 kg)    Physical Exam Vitals and nursing note reviewed.  Constitutional:      General: She is not in acute distress.    Appearance: Normal appearance. She is well-developed and well-groomed. She is obese. She is not ill-appearing or toxic-appearing.  HENT:     Head: Normocephalic and atraumatic.     Nose:     Comments: 05/30/18 is in place, covering mouth and nose. Eyes:     General: Lids are normal. Vision grossly intact.        Right eye: No discharge.        Left eye: No discharge.     Extraocular Movements: Extraocular movements intact.     Conjunctiva/sclera: Conjunctivae normal.     Pupils: Pupils are equal, round, and reactive to light.  Cardiovascular:     Rate and Rhythm: Normal rate and regular rhythm.     Pulses: Normal pulses.          Dorsalis pedis pulses are 2+ on  the right side and 2+ on the left side.     Heart sounds: Normal heart sounds. No murmur heard.  No friction rub. No gallop.   Pulmonary:     Effort: Pulmonary effort is normal. No respiratory distress.     Breath sounds: Normal breath sounds.  Musculoskeletal:     Cervical back: Normal.     Thoracic back: Normal.     Lumbar back: Spasms and tenderness present. No swelling or lacerations. Negative right straight leg raise test and negative left straight leg raise test.     Right lower leg: No edema.     Left lower leg: No edema.  Skin:    General: Skin is warm and dry.     Capillary Refill: Capillary refill takes less than 2 seconds.  Neurological:     General: No focal deficit present.     Mental Status: She is alert and oriented to person, place, and time.  Psychiatric:        Attention and Perception: Attention and perception normal.         Mood and Affect: Mood and affect normal.        Speech: Speech normal.        Behavior: Behavior normal. Behavior is cooperative.        Thought Content: Thought content normal.        Cognition and Memory: Cognition and memory normal.        Judgment: Judgment normal.    Results for orders placed or performed in visit on 09/19/19  HM PAP SMEAR  Result Value Ref Range   HM Pap smear      Negative intraepithelial cells but high risk HPV detected      Assessment & Plan:   Problem List Items Addressed This Visit      Musculoskeletal and Integument   Degenerative disc disease, lumbar    See acute bilateral lower back pain A/P      Relevant Medications   predniSONE (DELTASONE) 50 MG tablet   cyclobenzaprine (FLEXERIL) 10 MG tablet     Other   Acute bilateral low back pain without sciatica - Primary    Acute exacerbation of chronic lower back pain.  Will treat with prednisone 50mg  daily x 5 days and cyclobenzaprine 10mg  TID PRN.  Provided AVS with back exercises.    Plan: 1. Begin prednisone 50mg  daily x 5 days 2. Begin cyclobenzaprine 10mg  TID PRN 3. Work on back exercises/stretching 4. RTC if symptoms worsen or fail to improve      Relevant Medications   predniSONE (DELTASONE) 50 MG tablet   cyclobenzaprine (FLEXERIL) 10 MG tablet      Meds ordered this encounter  Medications  . predniSONE (DELTASONE) 50 MG tablet    Sig: Take 1 tablet daily x 5 days    Dispense:  5 tablet    Refill:  0  . cyclobenzaprine (FLEXERIL) 10 MG tablet    Sig: Take 1 tablet (10 mg total) by mouth 3 (three) times daily as needed for muscle spasms.    Dispense:  30 tablet    Refill:  0    Follow up plan: Return if symptoms worsen or fail to improve.   , FNP Family Nurse Practitioner [facility] 06/28/2020, 4:44 PM

## 2020-06-28 NOTE — Assessment & Plan Note (Signed)
See acute bilateral lower back pain A/P

## 2020-06-28 NOTE — Patient Instructions (Signed)
I have sent in a prescription for Prednisone 50mg  to take 1 tablet daily x 5 days.  I have also sent in a prescription for cyclobenzaprine to take 1/2-1 tablet (5-10mg ) every 8 hours as needed for muscle cramp/spasm  Can take Tylenol to 1000mg  up to 3 times daily for breakthrough pain for 3-5 days then only as needed  Please schedule a follow-up appointment with in 2 to 4 weeks if symptoms not improving, or sooner if worsening             Low Back Pain Exercises  See other page with pictures of each exercise.  Start with 1 or 2 of these exercises that you are most comfortable with. Do not do any exercises that cause you significant worsening pain. Some of these may cause some "stretching soreness" but it should go away after you stop the exercise, and get better over time. Gradually increase up to 3-4 exercises as tolerated.  Standing hamstring stretch: Place the heel of your leg on a stool about 15 inches high. Keep your knee straight. Lean forward, bending at the hips until you feel a mild stretch in the back of your thigh. Make sure you do not roll your shoulders and bend at the waist when doing this or you will stretch your lower back instead. Hold the stretch for 15 to 30 seconds. Repeat 3 times. Repeat the same stretch on your other leg.  Cat and camel: Get down on your hands and knees. Let your stomach sag, allowing your back to curve downward. Hold this position for 5 seconds. Then arch your back and hold for 5 seconds. Do 3 sets of 10.  Quadriped Arm/Leg Raises: Get down on your hands and knees. Tighten your abdominal muscles to stiffen your spine. While keeping your abdominals tight, raise one arm and the opposite leg away from you. Hold this position for 5 seconds. Lower your arm and leg slowly and alternate sides. Do this 10 times on each side.  Pelvic tilt: Lie on your back with your knees bent and your feet flat on the floor. Tighten your abdominal muscles and push  your lower back into the floor. Hold this position for 5 seconds, then relax. Do 3 sets of 10.  Partial curl: Lie on your back with your knees bent and your feet flat on the floor. Tighten your stomach muscles and flatten your back against the floor. Tuck your chin to your chest. With your hands stretched out in front of you, curl your upper body forward until your shoulders clear the floor. Hold this position for 3 seconds. Don't hold your breath. It helps to breathe out as you lift your shoulders up. Relax. Repeat 10 times. Build to 3 sets of 10. To challenge yourself, clasp your hands behind your head and keep your elbows out to the side.  Lower trunk rotation: Lie on your back with your knees bent and your feet flat on the floor. Tighten your abdominal muscles and push your lower back into the floor. Keeping your shoulders down flat, gently rotate your legs to one side, then the other as far as you can. Repeat 10 to 20 times.  Single knee to chest stretch: Lie on your back with your legs straight out in front of you. Bring one knee up to your chest and grasp the back of your thigh. Pull your knee toward your chest, stretching your buttock muscle. Hold this position for 15 to 30 seconds and return to  the starting position. Repeat 3 times on each side.  Double knee to chest: Lie on your back with your knees bent and your feet flat on the floor. Tighten your abdominal muscles and push your lower back into the floor. Pull both knees up to your chest. Hold for 5 seconds and repeat 10 to 20 times.  You will receive a survey after today's visit either digitally by e-mail or paper by [employer]. Your experiences and feedback matter to [place].  Please respond so we know how we are doing as we provide care for you.  Call us with any questions/concerns/needs.  It is my goal to be available to you for your health concerns.  Thanks for choosing me to be a partner in your healthcare needs!  Charlaine Dalton,  FNP-C Family Nurse Practitioner [facility] Phone: [phone]

## 2020-08-14 ENCOUNTER — Encounter: Payer: Self-pay | Admitting: Family Medicine

## 2020-08-14 NOTE — Telephone Encounter (Signed)
I contacted the patient to offer an appt. She said she was going to monitor her symptoms for a few days and if she doesn't notice any improvement she will call and schedule an appt.

## 2020-08-30 ENCOUNTER — Encounter: Payer: Self-pay | Admitting: Family Medicine

## 2020-08-30 NOTE — Telephone Encounter (Signed)
I called the patient and notified her that we do not have any opening on today. I recommended she seek Urgent Care on today. The pt declined an appt scheduled for tomorrow virtual at 11:00am. I informed the patient if her symptoms worsen that she need to seek emergent care. She verbalize understanding.

## 2020-08-31 ENCOUNTER — Other Ambulatory Visit: Payer: Self-pay

## 2020-08-31 ENCOUNTER — Encounter: Payer: Self-pay | Admitting: Family Medicine

## 2020-08-31 ENCOUNTER — Telehealth (INDEPENDENT_AMBULATORY_CARE_PROVIDER_SITE_OTHER): Payer: Managed Care, Other (non HMO) | Admitting: Family Medicine

## 2020-08-31 DIAGNOSIS — K224 Dyskinesia of esophagus: Secondary | ICD-10-CM

## 2020-08-31 MED ORDER — ALUMINUM-MAGNESIUM-SIMETHICONE 200-200-20 MG/5ML PO SUSP: 30.0000 mL | Freq: Three times a day (TID) | ORAL | 0 refills | Status: DC | PRN

## 2020-08-31 MED ORDER — LIDOCAINE VISCOUS HCL 2 % MT SOLN: 15.0000 mL | OROMUCOSAL | 0 refills | Status: DC | PRN

## 2020-08-31 NOTE — Telephone Encounter (Signed)
OV 08/31/2020 

## 2020-08-31 NOTE — Progress Notes (Signed)
Virtual Visit via Telephone  The purpose of this virtual visit is to provide medical care while limiting exposure to the novel coronavirus (COVID19) for both patient and office staff.  Consent was obtained for phone visit:  Yes.   Answered questions that patient had about telehealth interaction:  Yes.   I discussed the limitations, risks, security and privacy concerns of performing an evaluation and management service by telephone. I also discussed with the patient that there may be a patient responsible charge related to this service. The patient expressed understanding and agreed to proceed.  Patient is at home and is accessed via telephone Services are provided by Charlaine Dalton, FNP-C from [facility])  ---------------------------------------------------------------------- Chief Complaint  Patient presents with  . Cough    coughing/choking spell in my sleep, on Monday night. Now difficulty swallowing  and pain in the  chest as food, drink, or saliva goes down.     S: Reviewed CMA documentation. I have called patient and gathered additional HPI as follows:  Kimberly Neal presents for virtual telemedicine visit via telephone with concerns of having a coughing/choking spell in her sleep on Monday night.  Has had some difficulty swallowing with discomfort in her chest and left side of the base of her head when having food, drink or saliva going down.  Denies history of snoring or awakening coughing/gasping in the past. States this had happened when she was first starting to fall asleep.  Has not had a history of dysphagia in the past, not coughing when currently swallowing and able to do so successfully.  Patient is currently home Denies any high risk travel to areas of current concern for COVID19. Denies any known or suspected exposure to person with or possibly with COVID19.  Past Medical History:  Diagnosis Date  . Smoker   . UTI (urinary tract infection)     Social History   Tobacco Use  . Smoking status: Current Every Day Smoker    Packs/day: 0.50    Types: Cigarettes  . Smokeless tobacco: Never Used  Vaping Use  . Vaping Use: Never used  Substance Use Topics  . Alcohol use: Not Currently  . Drug use: No    Current Outpatient Medications:  .  Ascorbic Acid (VITAMIN C) 1000 MG tablet, Take 1,000 mg by mouth daily., Disp: , Rfl:  .  Cholecalciferol (VITAMIN D3) 125 MCG (5000 UT) CAPS, Take by mouth., Disp: , Rfl:  .  cyclobenzaprine (FLEXERIL) 10 MG tablet, Take 1 tablet (10 mg total) by mouth 3 (three) times daily as needed for muscle spasms., Disp: 30 tablet, Rfl: 0 .  vitamin B-12 (CYANOCOBALAMIN) 500 MCG tablet, Take 500 mcg by mouth daily., Disp: , Rfl:  .  Zinc Sulfate (ZINC 15 PO), Take by mouth., Disp: , Rfl:  .  aluminum-magnesium hydroxide-simethicone (MAALOX) 200-200-20 MG/5ML SUSP, Take 30 mLs by mouth 3 (three) times daily as needed., Disp: 710 mL, Rfl: 0 .  fexofenadine (ALLEGRA) 180 MG tablet, Take by mouth. (Patient not taking: Reported on 08/31/2020), Disp: , Rfl:  .  lidocaine (XYLOCAINE) 2 % solution, Use as directed 15 mLs in the mouth or throat as needed for mouth pain., Disp: 100 mL, Rfl: 0  Depression screen [facility] 2/9 09/19/2019 11/21/2016 10/29/2015  Decreased Interest 0 0 0  Down, Depressed, Hopeless 0 0 0  PHQ - 2 Score 0 0 0    GAD 7 : Generalized Anxiety Score 09/19/2019  Nervous, Anxious, on Edge 3  Control/stop  worrying 2  Worry too much - different things 2  Trouble relaxing 3  Restless 2  Easily annoyed or irritable 3  Afraid - awful might happen 3  Total GAD 7 Score 18  Anxiety Difficulty Very difficult    -------------------------------------------------------------------------- O: No physical exam performed due to remote telephone encounter.  Physical Exam: Patient remotely monitored without video.  Verbal communication appropriate.  Cognition normal.  No results found for this or any  previous visit (from the past 2160 hour(s)).  -------------------------------------------------------------------------- A&P:  Problem List Items Addressed This Visit      Digestive   Esophageal spasm - Primary    Likely secondary to coughing/choking on saliva while falling asleep.  Discussed could have caused a spasm that may take some time to resolve, we can try a topical/liquid medication mix of maalox and lidocaine to see if this can help numb the esophagus.  To be careful when eating after taking this medication, as it can numb the tongue as well.  If symptoms worsen or fail to improve, to RTC.      Relevant Medications   lidocaine (XYLOCAINE) 2 % solution   aluminum-magnesium hydroxide-simethicone (MAALOX) 200-200-20 MG/5ML SUSP      Meds ordered this encounter  Medications  . lidocaine (XYLOCAINE) 2 % solution    Sig: Use as directed 15 mLs in the mouth or throat as needed for mouth pain.    Dispense:  100 mL    Refill:  0  . aluminum-magnesium hydroxide-simethicone (MAALOX) 200-200-20 MG/5ML SUSP    Sig: Take 30 mLs by mouth 3 (three) times daily as needed.    Dispense:  710 mL    Refill:  0    To take with lidocaine rx    Follow-up: - Return if symptoms worsen or fail to improve  Patient verbalizes understanding with the above medical recommendations including the limitation of remote medical advice.  Specific follow-up and call-back criteria were given for patient to follow-up or seek medical care more urgently if needed.  - Time spent in direct consultation with patient on phone: 6 minutes  Charlaine Dalton, FNP-C [facility] 08/31/2020, 12:45 PM

## 2020-08-31 NOTE — Assessment & Plan Note (Signed)
Likely secondary to coughing/choking on saliva while falling asleep.  Discussed could have caused a spasm that may take some time to resolve, we can try a topical/liquid medication mix of maalox and lidocaine to see if this can help numb the esophagus.  To be careful when eating after taking this medication, as it can numb the tongue as well.  If symptoms worsen or fail to improve, to RTC.

## 2020-09-17 ENCOUNTER — Other Ambulatory Visit: Payer: Self-pay

## 2020-09-17 ENCOUNTER — Encounter: Payer: Self-pay | Admitting: Family Medicine

## 2020-09-17 ENCOUNTER — Ambulatory Visit (INDEPENDENT_AMBULATORY_CARE_PROVIDER_SITE_OTHER): Payer: BC Managed Care – PPO | Admitting: Family Medicine

## 2020-09-17 VITALS — BP 107/63 | HR 65 | Temp 97.8°F | Ht 65.0 in | Wt 199.6 lb

## 2020-09-17 DIAGNOSIS — Z0000 Encounter for general adult medical examination without abnormal findings: Secondary | ICD-10-CM | POA: Diagnosis not present

## 2020-09-17 DIAGNOSIS — R42 Dizziness and giddiness: Secondary | ICD-10-CM | POA: Diagnosis not present

## 2020-09-17 DIAGNOSIS — L680 Hirsutism: Secondary | ICD-10-CM | POA: Diagnosis not present

## 2020-09-17 NOTE — Assessment & Plan Note (Signed)
Dizziness when going from deep squat to full standing.  No orthostatic dizziness.  Discussed compression stocking to avoid this, as well as increasing water intake.  To avoid deep squatting with positional changes.  Will have labs drawn for evaluation of anemia and TSH.  Patient in agreement.

## 2020-09-17 NOTE — Patient Instructions (Signed)
Have your labs drawn and we will contact you with the results.  As we discussed, can increase your water intake and look into compression stocking to help with dizziness when going from squatting to standing.   Well Visit: Care Instructions Overview  Well visits can help you stay healthy. Your provider has checked your overall health and may have suggested ways to take good care of yourself. Your provider also may have recommended tests. At home, you can help prevent illness with healthy eating, regular exercise, and other steps.  Follow-up care is a key part of your treatment and safety. Be sure to make and go to all appointments, and call your provider if you are having problems. It's also a good idea to know your test results and keep a list of the medicines you take.  How can you care for yourself at home?   Get screening tests that you and your doctor decide on. Screening helps find diseases before any symptoms appear.   Eat healthy foods. Choose fruits, vegetables, whole grains, protein, and low-fat dairy foods. Limit fat, especially saturated fat. Reduce salt in your diet.   Limit alcohol. If you are a man, have no more than 2 drinks a day or 14 drinks a week. If you are a woman, have no more than 1 drink a day or 7 drinks a week.   Get at least 30 minutes of physical activity on most days of the week.  We recommend you go no more than 2 days in a row without exercise. Walking is a good choice. You also may want to do other activities, such as running, swimming, cycling, or playing tennis or team sports. Discuss any changes in your exercise program with your provider.   Reach and stay at a healthy weight. This will lower your risk for many problems, such as obesity, diabetes, heart disease, and high blood pressure.   Do not smoke or allow others to smoke around you. If you need help quitting, talk to your provider about stop-smoking programs and medicines. These can increase your  chances of quitting for good.  Can call 1-800-QUIT-NOW ((717) 487-5335) for the [facility], assistance with smoking cessation.   Care for your mental health. It is easy to get weighed down by worry and stress. Learn strategies to manage stress, like deep breathing and mindfulness, and stay connected with your family and community. If you find you often feel sad or hopeless, talk with your provider. Treatment can help.   Talk to your provider about whether you have any risk factors for sexually transmitted infections (STIs). You can help prevent STIs if you wait to have sex with a new partner (or partners) until you've each been tested for STIs. It also helps if you use condoms (female or female condoms) and if you limit your sex partners to one person who only has sex with you. Vaccines are available for some STIs, such as HPV (these are age dependent).   If you think you may have a problem with alcohol or drug use, talk to your provider. This includes prescription medicines (such as amphetamines and opioids) and illegal drugs (such as cocaine and methamphetamine). Your provider can help you figure out what type of treatment is best for you.   If you have concerns about domestic violence or intimate partner violence, there are resources available to you. National Domestic Abuse Hotline 906-808-1371   Protect your skin from too much sun. When you're outdoors from 10 a.m.  to 4 p.m., stay in the shade or cover up with clothing and a hat with a wide brim. Wear sunglasses that block UV rays. Even when it's cloudy, put broad-spectrum sunscreen (SPF 30 or higher) on any exposed skin.   See a dentist one or two times a year for checkups and to have your teeth cleaned.   See an eye doctor once per year for an eye exam.   Wear a seat belt in the car.  When should you call for help?  Watch closely for changes in your health, and be sure to contact your provider if you have any problems or  symptoms that concern you.  We will plan to see you back in 12 months for your physical and we will plan to see you back if your dizziness symptoms worsen or fail to improve  You will receive a survey after today's visit either digitally by e-mail or paper by [employer] mail. Your experiences and feedback matter to [place].  Please respond so we know how we are doing as we provide care for you.  Call us with any questions/concerns/needs.  It is my goal to be available to you for your health concerns.  Thanks for choosing me to be a partner in your healthcare needs!  Charlaine Dalton, FNP-C Family Nurse Practitioner [facility] Phone: [phone]

## 2020-09-17 NOTE — Progress Notes (Signed)
Subjective:    Patient ID: Kimberly Neal, female    DOB: 01-11-1986, 34 y.o.   MRN: [id number]  Kimberly Neal is a 34 y.o. female presenting on 09/17/2020 for Annual Exam   HPI  HEALTH MAINTENANCE:  Weight/BMI: Obese, BMI 33.22% Physical activity: Stays active Diet: Regular Seatbelt: Always Sunscreen: Yes PAP: Completed 05/24/2019, Due 05/23/2022 HIV & Hep C Screening: Offered and declined  GC/CT: Offered and declined  Optometry: Yearly  Dentistry: Has one, going to get check up scheduled  IMMUNIZATIONS: Tetanus: Up to date 11/10/2017 Influenza: Declines COVID: Discussed  Depression screen [facility] 2/9 09/19/2019 11/21/2016 10/29/2015  Decreased Interest 0 0 0  Down, Depressed, Hopeless 0 0 0  PHQ - 2 Score 0 0 0    Past Medical History:  Diagnosis Date  . Smoker   . UTI (urinary tract infection)    Past Surgical History:  Procedure Laterality Date  . TONSILLECTOMY AND ADENOIDECTOMY     Social History   Socioeconomic History  . Marital status: Married    Spouse name: Not on file  . Number of children: Not on file  . Years of education: Not on file  . Highest education level: Not on file  Occupational History  . Not on file  Tobacco Use  . Smoking status: Current Every Day Smoker    Packs/day: 0.50    Types: Cigarettes  . Smokeless tobacco: Never Used  Vaping Use  . Vaping Use: Never used  Substance and Sexual Activity  . Alcohol use: Not Currently  . Drug use: No  . Sexual activity: Not on file  Other Topics Concern  . Not on file  Social History Narrative  . Not on file   Social Determinants of Health   Financial Resource Strain: Not on file  Food Insecurity: Not on file  Transportation Needs: Not on file  Physical Activity: Not on file  Stress: Not on file  Social Connections: Not on file  Intimate Partner Violence: Not on file   Family History  Problem Relation Age of Onset  . COPD Father    Current Outpatient Medications on  File Prior to Visit  Medication Sig  . Ascorbic Acid (VITAMIN C) 1000 MG tablet Take 1,000 mg by mouth daily.  . Cholecalciferol (VITAMIN D3) 125 MCG (5000 UT) CAPS Take by mouth.  . cyclobenzaprine (FLEXERIL) 10 MG tablet Take 1 tablet (10 mg total) by mouth 3 (three) times daily as needed for muscle spasms.  . vitamin B-12 (CYANOCOBALAMIN) 500 MCG tablet Take 500 mcg by mouth daily.  . Zinc Sulfate (ZINC 15 PO) Take by mouth.  . fexofenadine (ALLEGRA) 180 MG tablet Take by mouth. (Patient not taking: No sig reported)   No current facility-administered medications on file prior to visit.    Per HPI unless specifically indicated above     Objective:    BP 107/63 (BP Location: Right Arm, Patient Position: Sitting, Cuff Size: Large)   Pulse 65   Temp 97.8 F (36.6 C) (Oral)   Ht 5\' 5"  (1.651 m)   Wt 199 lb 9.6 oz (90.5 kg)   LMP 09/11/2020   Breastfeeding No   BMI 33.22 kg/m   Wt Readings from Last 3 Encounters:  09/17/20 199 lb 9.6 oz (90.5 kg)  06/28/20 194 lb 9.6 oz (88.3 kg)  09/19/19 218 lb (98.9 kg)    Physical Exam Vitals and nursing note reviewed.  Constitutional:      General: She is not in acute  distress.    Appearance: Normal appearance. She is well-developed and well-groomed. She is obese. She is not ill-appearing or toxic-appearing.  HENT:     Head: Normocephalic and atraumatic.     Right Ear: Tympanic membrane, ear canal and external ear normal. There is no impacted cerumen.     Left Ear: Tympanic membrane, ear canal and external ear normal. There is no impacted cerumen.     Nose: Nose normal. No congestion or rhinorrhea.     Mouth/Throat:     Lips: Pink.     Mouth: Mucous membranes are moist.     Pharynx: Oropharynx is clear. Uvula midline. No oropharyngeal exudate or posterior oropharyngeal erythema.  Eyes:     General: Lids are normal. Vision grossly intact. No scleral icterus.       Right eye: No discharge.        Left eye: No discharge.      Extraocular Movements: Extraocular movements intact.     Conjunctiva/sclera: Conjunctivae normal.     Pupils: Pupils are equal, round, and reactive to light.  Neck:     Thyroid: No thyroid mass or thyromegaly.  Cardiovascular:     Rate and Rhythm: Normal rate and regular rhythm.     Pulses: Normal pulses.          Dorsalis pedis pulses are 2+ on the right side and 2+ on the left side.     Heart sounds: Normal heart sounds. No murmur heard. No friction rub. No gallop.   Pulmonary:     Effort: Pulmonary effort is normal. No respiratory distress.     Breath sounds: Normal breath sounds.  Abdominal:     General: Abdomen is flat. Bowel sounds are normal. There is no distension.     Palpations: Abdomen is soft. There is no hepatomegaly, splenomegaly or mass.     Tenderness: There is no abdominal tenderness. There is no guarding or rebound.     Hernia: No hernia is present.  Musculoskeletal:        General: Normal range of motion.     Cervical back: Normal range of motion and neck supple. No tenderness.     Right lower leg: No edema.     Left lower leg: No edema.     Comments: Normal tone, strength 5/5 BUE & BLE  Feet:     Right foot:     Skin integrity: Skin integrity normal.     Left foot:     Skin integrity: Skin integrity normal.  Lymphadenopathy:     Head:     Right side of head: No submental, submandibular, tonsillar, preauricular, posterior auricular or occipital adenopathy.     Left side of head: No submental, submandibular, tonsillar, preauricular, posterior auricular or occipital adenopathy.     Cervical: No cervical adenopathy.     Right cervical: No superficial, deep or posterior cervical adenopathy.    Left cervical: No superficial, deep or posterior cervical adenopathy.  Skin:    General: Skin is warm and dry.     Capillary Refill: Capillary refill takes less than 2 seconds.  Neurological:     General: No focal deficit present.     Mental Status: She is alert and  oriented to person, place, and time.     Cranial Nerves: No cranial nerve deficit.     Sensory: No sensory deficit.     Motor: No weakness.     Coordination: Coordination normal.     Gait: Gait normal.  Deep Tendon Reflexes: Reflexes normal.  Psychiatric:        Attention and Perception: Attention and perception normal.        Mood and Affect: Mood and affect normal.        Speech: Speech normal.        Behavior: Behavior normal. Behavior is cooperative.        Thought Content: Thought content normal.        Cognition and Memory: Cognition and memory normal.        Judgment: Judgment normal.     Results for orders placed or performed in visit on 09/19/19  HM PAP SMEAR  Result Value Ref Range   HM Pap smear      Negative intraepithelial cells but high risk HPV detected      Assessment & Plan:   Problem List Items Addressed This Visit      Musculoskeletal and Integument   Hirsutism    No previous hx of PCOS.  Will have labs drawn for insulin, testosterone and SHBG.  Discussed if showing abnormalities, can order a TV U/S for confirmation.  Patient in agreement, will have labs drawn today.      Relevant Orders   Insulin, Free (Bioactive)   Testosterone, Free, Total, SHBG     Other   Dizziness    Dizziness when going from deep squat to full standing.  No orthostatic dizziness.  Discussed compression stocking to avoid this, as well as increasing water intake.  To avoid deep squatting with positional changes.  Will have labs drawn for evaluation of anemia and TSH.  Patient in agreement.      Relevant Orders   CBC with Differential   COMPLETE METABOLIC PANEL WITH GFR   Annual physical exam - Primary    Annual physical exam without new findings.  Well adult with acute concerns of hirsutism and intermittent dizziness with certain positional changes.  Plan: 1. Obtain health maintenance screenings as above according to age. - Increase physical activity to 30 minutes most days  of the week.  - Eat healthy diet high in vegetables and fruits; low in refined carbohydrates. - Screening labs and tests as ordered 2. Return 1 year for annual physical.       Relevant Orders   TSH + free T4      No orders of the defined types were placed in this encounter.  Follow up plan: Return in about 1 year (around 09/17/2021) for CPE.  Charlaine Dalton, FNP-C Family Nurse Practitioner [facility] 09/17/2020, 12:30 PM

## 2020-09-17 NOTE — Assessment & Plan Note (Signed)
Annual physical exam without new findings.  Well adult with acute concerns of hirsutism and intermittent dizziness with certain positional changes.  Plan: 1. Obtain health maintenance screenings as above according to age. - Increase physical activity to 30 minutes most days of the week.  - Eat healthy diet high in vegetables and fruits; low in refined carbohydrates. - Screening labs and tests as ordered 2. Return 1 year for annual physical.

## 2020-09-17 NOTE — Assessment & Plan Note (Signed)
No previous hx of PCOS.  Will have labs drawn for insulin, testosterone and SHBG.  Discussed if showing abnormalities, can order a TV U/S for confirmation.  Patient in agreement, will have labs drawn today.

## 2020-09-20 LAB — CBC WITH DIFFERENTIAL/PLATELET
Absolute Monocytes: 864 cells/uL (ref 200–950)
Basophils Absolute: 54 cells/uL (ref 0–200)
Basophils Relative: 0.5 %
Eosinophils Absolute: 86 cells/uL (ref 15–500)
Eosinophils Relative: 0.8 %
HCT: 38.8 % (ref 35.0–45.0)
Hemoglobin: 13.6 g/dL (ref 11.7–15.5)
Lymphs Abs: 3499 cells/uL (ref 850–3900)
MCH: 32.9 pg (ref 27.0–33.0)
MCHC: 35.1 g/dL (ref 32.0–36.0)
MCV: 93.9 fL (ref 80.0–100.0)
MPV: 10.8 fL (ref 7.5–12.5)
Monocytes Relative: 8 %
Neutro Abs: 6296 cells/uL (ref 1500–7800)
Neutrophils Relative %: 58.3 %
Platelets: 287 10*3/uL (ref 140–400)
RBC: 4.13 10*6/uL (ref 3.80–5.10)
RDW: 12 % (ref 11.0–15.0)
Total Lymphocyte: 32.4 %
WBC: 10.8 10*3/uL (ref 3.8–10.8)

## 2020-09-20 LAB — COMPLETE METABOLIC PANEL WITH GFR
AG Ratio: 1.8 (calc) (ref 1.0–2.5)
ALT: 12 U/L (ref 6–29)
AST: 17 U/L (ref 10–30)
Albumin: 4.2 g/dL (ref 3.6–5.1)
Alkaline phosphatase (APISO): 51 U/L (ref 31–125)
BUN: 10 mg/dL (ref 7–25)
CO2: 25 mmol/L (ref 20–32)
Calcium: 9 mg/dL (ref 8.6–10.2)
Chloride: 107 mmol/L (ref 98–110)
Creat: 0.74 mg/dL (ref 0.50–1.10)
GFR, Est African American: 121 mL/min/{1.73_m2} (ref >=60)
GFR, Est Non African American: 104 mL/min/{1.73_m2} (ref >=60)
Globulin: 2.3 g/dL (calc) (ref 1.9–3.7)
Glucose, Bld: 78 mg/dL (ref 65–99)
Potassium: 3.7 mmol/L (ref 3.5–5.3)
Sodium: 142 mmol/L (ref 135–146)
Total Bilirubin: 0.3 mg/dL (ref 0.2–1.2)
Total Protein: 6.5 g/dL (ref 6.1–8.1)

## 2020-09-20 LAB — INSULIN, FREE (BIOACTIVE): Insulin, Free: 4.8 u[IU]/mL (ref 1.5–14.9)

## 2020-09-20 LAB — TSH+FREE T4: TSH W/REFLEX TO FT4: 0.89 mIU/L

## 2020-10-08 NOTE — Progress Notes (Signed)
Ok.  Can you let patient know and see if she wants to have done at [facility] or if wants through [facility]?  If she wants through [facility] she can go and they can draw, if through [facility], I can reorder for our clinic.  TY

## 2020-10-23 ENCOUNTER — Encounter: Payer: Self-pay | Admitting: Family Medicine

## 2020-10-23 ENCOUNTER — Other Ambulatory Visit: Payer: Self-pay | Admitting: Family Medicine

## 2020-10-23 MED ORDER — ALBUTEROL SULFATE HFA 108 (90 BASE) MCG/ACT IN AERS: 2.0000 | INHALATION_SPRAY | Freq: Four times a day (QID) | RESPIRATORY_TRACT | 0 refills | Status: DC | PRN

## 2021-10-10 LAB — RESULTS CONSOLE HPV: CHL HPV: POSITIVE

## 2021-10-10 LAB — HM PAP SMEAR

## 2021-11-06 ENCOUNTER — Encounter: Payer: Self-pay | Admitting: Nurse Practitioner

## 2021-11-06 ENCOUNTER — Other Ambulatory Visit: Payer: Self-pay

## 2021-11-06 ENCOUNTER — Ambulatory Visit (INDEPENDENT_AMBULATORY_CARE_PROVIDER_SITE_OTHER): Payer: BC Managed Care – PPO | Admitting: Nurse Practitioner

## 2021-11-06 VITALS — BP 102/68 | HR 75 | Temp 98.2°F | Ht 64.5 in | Wt 182.0 lb

## 2021-11-06 DIAGNOSIS — F339 Major depressive disorder, recurrent, unspecified: Secondary | ICD-10-CM | POA: Diagnosis not present

## 2021-11-06 DIAGNOSIS — F17220 Nicotine dependence, chewing tobacco, uncomplicated: Secondary | ICD-10-CM | POA: Insufficient documentation

## 2021-11-06 DIAGNOSIS — Z7689 Persons encountering health services in other specified circumstances: Secondary | ICD-10-CM | POA: Diagnosis not present

## 2021-11-06 DIAGNOSIS — F17200 Nicotine dependence, unspecified, uncomplicated: Secondary | ICD-10-CM | POA: Diagnosis not present

## 2021-11-06 DIAGNOSIS — F419 Anxiety disorder, unspecified: Secondary | ICD-10-CM | POA: Diagnosis not present

## 2021-11-06 MED ORDER — SERTRALINE HCL 25 MG PO TABS: 25.0000 mg | ORAL_TABLET | Freq: Every day | ORAL | 0 refills | Status: DC

## 2021-11-06 NOTE — Assessment & Plan Note (Signed)
Patient would like to discuss smoking cessation.  Will get Mood under control prior to discussing smoking cessation.  Patient understands and agrees with the plan of care.

## 2021-11-06 NOTE — Progress Notes (Signed)
BP 102/68    Pulse 75    Temp 98.2 F (36.8 C) (Oral)    Ht 5' 4.5" (1.638 m)    Wt 182 lb (82.6 kg)    LMP 10/27/2021 (Exact Date)    SpO2 99%    BMI 30.76 kg/m    Subjective:    Patient ID: Kimberly Neal, female    DOB: 1986-08-18, 36 y.o.   MRN: [id number]  HPI: Kimberly Neal is a 36 y.o. female  Chief Complaint  Patient presents with   New Patient (Initial Visit)    Pt states she would like to talk about her mental health. States she has been a lot more irritable lately. Would also like to discuss smoking   Patient presents to clinic to establish care with new PCP.  Introduced to Publishing rights manager role and practice setting.  All questions answered.  Discussed provider/patient relationship and expectations.  Patient reports a history of Depression and anxiety. She smokes about 1/2ppd.   Patient denies a history of: Hypertension, Elevated Cholesterol, Diabetes, Thyroid problems, Neurological problems, and Abdominal problems.    ANXIETY/STRESS Patient states she has tried lexapro in the past.   Duration:uncontrolled- feels like it has worsened in the last two months. Anxious mood: no  Excessive worrying: no Irritability: yes  Sweating:  night sweats Nausea: no Palpitations:no Hyperventilation:  not recently Panic attacks: no Agoraphobia: no  Obscessions/compulsions: no Depressed mood: yes Depression screen [facility] 2/9 11/06/2021 09/19/2019 11/21/2016 10/29/2015 06/20/2015  Decreased Interest 2 0 0 0 0  Down, Depressed, Hopeless 2 0 0 0 0  PHQ - 2 Score 4 0 0 0 0  Altered sleeping 1 - - - -  Tired, decreased energy 1 - - - -  Change in appetite 0 - - - -  Feeling bad or failure about yourself  1 - - - -  Trouble concentrating 0 - - - -  Moving slowly or fidgety/restless 0 - - - -  Suicidal thoughts 0 - - - -  PHQ-9 Score 7 - - - -  Difficult doing work/chores Somewhat difficult - - - -   Weight changes: no Insomnia: yes  both hard to fall asleep and stay  asleep   Hypersomnia: yes Fatigue/loss of energy: yes Feelings of worthlessness: no Feelings of guilt: yes Impaired concentration/indecisiveness: yes Suicidal ideations: no  Crying spells: yes Recent Stressors/Life Changes: yes   Relationship problems: no   Family stress: yes     Financial stress: yes    Job stress: yes    Recent death/loss: no  SMOKING CESSATION Smoking Status: Current everyday smoker- has tried chantix Smoking Amount: 1/2 ppd Smoking Onset: 20 years Smoking Quit Date:  Smoking triggers: stress Type of tobacco use: cigarettes  Children in the house: yes Other household members who smoke: yes Treatments attempted: chantix, patch and gum    Active Ambulatory Problems    Diagnosis Date Noted   Degenerative disc disease, lumbar 06/20/2015   ASCUS with positive high risk HPV cervical 09/23/2017   Acute bilateral low back pain without sciatica 06/28/2020   Esophageal spasm 08/31/2020   Dizziness 09/17/2020   Annual physical exam 09/17/2020   Hirsutism 09/17/2020   Depression, recurrent (HCC) 11/06/2021   Anxiety 11/06/2021   Smoker    Resolved Ambulatory Problems    Diagnosis Date Noted   No Resolved Ambulatory Problems   Past Medical History:  Diagnosis Date   DDD (degenerative disc disease), lumbar    HPV in  female    UTI (urinary tract infection)    Past Surgical History:  Procedure Laterality Date   TONSILLECTOMY AND ADENOIDECTOMY     TUBAL LIGATION  01/10/2018   Family History  Problem Relation Age of Onset   COPD Father    Leukemia Father    Hypertension Brother    Hyperlipidemia Brother    Asthma Son    Heart disease Maternal Grandmother    Diabetes Maternal Grandmother    Lung cancer Maternal Grandmother    Lung disease Maternal Grandfather    Kidney disease Paternal Grandmother    Diabetes Paternal Grandmother    Leukemia Paternal Grandfather      Review of Systems  Psychiatric/Behavioral:  Positive for dysphoric mood.  Negative for suicidal ideas. The patient is nervous/anxious.    Per HPI unless specifically indicated above     Objective:    BP 102/68    Pulse 75    Temp 98.2 F (36.8 C) (Oral)    Ht 5' 4.5" (1.638 m)    Wt 182 lb (82.6 kg)    LMP 10/27/2021 (Exact Date)    SpO2 99%    BMI 30.76 kg/m   Wt Readings from Last 3 Encounters:  11/06/21 182 lb (82.6 kg)  09/17/20 199 lb 9.6 oz (90.5 kg)  06/28/20 194 lb 9.6 oz (88.3 kg)    Physical Exam Vitals and nursing note reviewed.  Constitutional:      General: She is not in acute distress.    Appearance: Normal appearance. She is normal weight. She is not ill-appearing, toxic-appearing or diaphoretic.  HENT:     Head: Normocephalic.     Right Ear: External ear normal.     Left Ear: External ear normal.     Nose: Nose normal.     Mouth/Throat:     Mouth: Mucous membranes are moist.     Pharynx: Oropharynx is clear.  Eyes:     General:        Right eye: No discharge.        Left eye: No discharge.     Extraocular Movements: Extraocular movements intact.     Conjunctiva/sclera: Conjunctivae normal.     Pupils: Pupils are equal, round, and reactive to light.  Cardiovascular:     Rate and Rhythm: Normal rate and regular rhythm.     Heart sounds: No murmur heard. Pulmonary:     Effort: Pulmonary effort is normal. No respiratory distress.     Breath sounds: Normal breath sounds. No wheezing or rales.  Musculoskeletal:     Cervical back: Normal range of motion and neck supple.  Skin:    General: Skin is warm and dry.     Capillary Refill: Capillary refill takes less than 2 seconds.  Neurological:     General: No focal deficit present.     Mental Status: She is alert and oriented to person, place, and time. Mental status is at baseline.  Psychiatric:        Mood and Affect: Mood normal.        Behavior: Behavior normal.        Thought Content: Thought content normal.        Judgment: Judgment normal.    Results for orders placed or  performed in visit on 09/17/20  CBC with Differential  Result Value Ref Range   WBC 10.8 3.8 - 10.8 Thousand/uL   RBC 4.13 3.80 - 5.10 Million/uL   Hemoglobin 13.6 11.7 - 15.5 g/dL  HCT 38.8 35.0 - 45.0 %   MCV 93.9 80.0 - 100.0 fL   MCH 32.9 27.0 - 33.0 pg   MCHC 35.1 32.0 - 36.0 g/dL   RDW 65.5 37.4 - 82.7 %   Platelets 287 140 - 400 Thousand/uL   MPV 10.8 7.5 - 12.5 fL   Neutro Abs 6,296 1,500 - 7,800 cells/uL   Lymphs Abs 3,499 850 - 3,900 cells/uL   Absolute Monocytes 864 200 - 950 cells/uL   Eosinophils Absolute 86 15 - 500 cells/uL   Basophils Absolute 54 0 - 200 cells/uL   Neutrophils Relative % 58.3 %   Total Lymphocyte 32.4 %   Monocytes Relative 8.0 %   Eosinophils Relative 0.8 %   Basophils Relative 0.5 %  COMPLETE METABOLIC PANEL WITH GFR  Result Value Ref Range   Glucose, Bld 78 65 - 99 mg/dL   BUN 10 7 - 25 mg/dL   Creat 0.78 6.75 - 4.49 mg/dL   GFR, Est Non African American 104 > OR = 60 mL/min/1.78m2   GFR, Est African American 121 > OR = 60 mL/min/1.39m2   BUN/Creatinine Ratio NOT APPLICABLE 6 - 22 (calc)   Sodium 142 135 - 146 mmol/L   Potassium 3.7 3.5 - 5.3 mmol/L   Chloride 107 98 - 110 mmol/L   CO2 25 20 - 32 mmol/L   Calcium 9.0 8.6 - 10.2 mg/dL   Total Protein 6.5 6.1 - 8.1 g/dL   Albumin 4.2 3.6 - 5.1 g/dL   Globulin 2.3 1.9 - 3.7 g/dL (calc)   AG Ratio 1.8 1.0 - 2.5 (calc)   Total Bilirubin 0.3 0.2 - 1.2 mg/dL   Alkaline phosphatase (APISO) 51 31 - 125 U/L   AST 17 10 - 30 U/L   ALT 12 6 - 29 U/L  TSH + free T4  Result Value Ref Range   TSH W/REFLEX TO FT4 0.89 mIU/L  Insulin, Free (Bioactive)  Result Value Ref Range   Insulin, Free 4.8 1.5 - 14.9 uIU/mL      Assessment & Plan:   Problem List Items Addressed This Visit       Other   Depression, recurrent (HCC) - Primary    Chronic. Not well controlled. Will start Zoloft 25mg  daily.  Side effects and benefits of medication discussed during visit.  Follow up in 2 weeks for  reevaluation.       Relevant Medications   sertraline (ZOLOFT) 25 MG tablet   Anxiety    Chronic. Not well controlled. Will start Zoloft 25mg  daily.  Side effects and benefits of medication discussed during visit.  Follow up in 2 weeks for reevaluation.       Relevant Medications   sertraline (ZOLOFT) 25 MG tablet   Smoker    Patient would like to discuss smoking cessation.  Will get Mood under control prior to discussing smoking cessation.  Patient understands and agrees with the plan of care.       Other Visit Diagnoses     Encounter to establish care            Follow up plan: Return in about 2 weeks (around 11/20/2021) for Depression/Anxiety FU.

## 2021-11-06 NOTE — Assessment & Plan Note (Signed)
Chronic. Not well controlled. Will start Zoloft 25mg  daily.  Side effects and benefits of medication discussed during visit.  Follow up in 2 weeks for reevaluation.

## 2021-11-21 ENCOUNTER — Encounter: Payer: Self-pay | Admitting: Nurse Practitioner

## 2021-11-28 ENCOUNTER — Encounter: Payer: Self-pay | Admitting: Nurse Practitioner

## 2021-11-28 ENCOUNTER — Other Ambulatory Visit: Payer: Self-pay

## 2021-11-28 ENCOUNTER — Ambulatory Visit (INDEPENDENT_AMBULATORY_CARE_PROVIDER_SITE_OTHER): Payer: BC Managed Care – PPO | Admitting: Nurse Practitioner

## 2021-11-28 DIAGNOSIS — F419 Anxiety disorder, unspecified: Secondary | ICD-10-CM

## 2021-11-28 DIAGNOSIS — F339 Major depressive disorder, recurrent, unspecified: Secondary | ICD-10-CM

## 2021-11-28 MED ORDER — SERTRALINE HCL 100 MG PO TABS: 100.0000 mg | ORAL_TABLET | Freq: Every day | ORAL | 0 refills | Status: DC

## 2021-11-28 NOTE — Assessment & Plan Note (Signed)
Chronic.  Improved from prior with the Zoloft.  Patient agrees with increasing to 100mg daily.  Will send new prescription to the pharmacy for patient. Follow up in 1 month for reevaluation.  ?

## 2021-11-28 NOTE — Assessment & Plan Note (Signed)
Chronic.  Improved from prior with the Zoloft.  Patient agrees with increasing to 100mg  daily.  Will send new prescription to the pharmacy for patient. Follow up in 1 month for reevaluation.  ?

## 2021-11-28 NOTE — Progress Notes (Signed)
? ?BP 106/71   Pulse 67   Temp 99.4 ?F (37.4 ?C) (Oral)   Wt 182 lb 9.6 oz (82.8 kg)   LMP 11/20/2021 (Exact Date)   SpO2 98%   BMI 30.86 kg/m?   ? ?Subjective:  ? ? Patient ID: Kimberly Neal, female    DOB: 06-23-1986, 36 y.o.   MRN: [id number] ? ?HPI: ?Kimberly Neal is a 36 y.o. female ? ?Chief Complaint  ?Patient presents with  ? Anxiety  ? ?DEPRESSION/ANXIETY ?Patient states she feels like the 50 mg is working better than the 25mg .  Patient feels like she would like to increase to 100mg .  It did increase her anxiety at first but it got better.  ? ? ?[place] Office Visit from 11/28/2021 in [place]  ?PHQ-9 Total Score 7  ? ?  ? ?GAD 7 : Generalized Anxiety Score 11/28/2021 11/06/2021 09/19/2019  ?Nervous, Anxious, on Edge 1 3 3   ?Control/stop worrying 1 2 2   ?Worry too much - different things 1 2 2   ?Trouble relaxing 1 2 3   ?Restless 1 0 2  ?Easily annoyed or irritable 1 3 3   ?Afraid - awful might happen 1 1 3   ?Total GAD 7 Score 7 13 18   ?Anxiety Difficulty Somewhat difficult Somewhat difficult Very difficult  ? ? ? ?Relevant past medical, surgical, family and social history reviewed and updated as indicated. Interim medical history since our last visit reviewed. ?Allergies and medications reviewed and updated. ? ?Review of Systems  ?Psychiatric/Behavioral:  Positive for dysphoric mood. Negative for suicidal ideas. The patient is nervous/anxious.   ? ?Per HPI unless specifically indicated above ? ?   ?Objective:  ?  ?BP 106/71   Pulse 67   Temp 99.4 ?F (37.4 ?C) (Oral)   Wt 182 lb 9.6 oz (82.8 kg)   LMP 11/20/2021 (Exact Date)   SpO2 98%   BMI 30.86 kg/m?   ?Wt Readings from Last 3 Encounters:  ?11/28/21 182 lb 9.6 oz (82.8 kg)  ?11/06/21 182 lb (82.6 kg)  ?09/17/20 199 lb 9.6 oz (90.5 kg)  ?  ?Physical Exam ?Vitals and nursing note reviewed.  ?Constitutional:   ?   General: She is not in acute distress. ?   Appearance: Normal appearance. She is normal weight. She is not  ill-appearing, toxic-appearing or diaphoretic.  ?HENT:  ?   Head: Normocephalic.  ?   Right Ear: External ear normal.  ?   Left Ear: External ear normal.  ?   Nose: Nose normal.  ?   Mouth/Throat:  ?   Mouth: Mucous membranes are moist.  ?   Pharynx: Oropharynx is clear.  ?Eyes:  ?   General:     ?   Right eye: No discharge.     ?   Left eye: No discharge.  ?   Extraocular Movements: Extraocular movements intact.  ?   Conjunctiva/sclera: Conjunctivae normal.  ?   Pupils: Pupils are equal, round, and reactive to light.  ?Cardiovascular:  ?   Rate and Rhythm: Normal rate and regular rhythm.  ?   Heart sounds: No murmur heard. ?Pulmonary:  ?   Effort: Pulmonary effort is normal. No respiratory distress.  ?   Breath sounds: Normal breath sounds. No wheezing or rales.  ?Musculoskeletal:  ?   Cervical back: Normal range of motion and neck supple.  ?Skin: ?   General: Skin is warm and dry.  ?   Capillary Refill: Capillary refill takes less than  2 seconds.  ?Neurological:  ?   General: No focal deficit present.  ?   Mental Status: She is alert and oriented to person, place, and time. Mental status is at baseline.  ?Psychiatric:     ?   Mood and Affect: Mood normal.     ?   Behavior: Behavior normal.     ?   Thought Content: Thought content normal.     ?   Judgment: Judgment normal.  ? ? ?Results for orders placed or performed in visit on 09/17/20  ?CBC with Differential  ?Result Value Ref Range  ? WBC 10.8 3.8 - 10.8 Thousand/uL  ? RBC 4.13 3.80 - 5.10 Million/uL  ? Hemoglobin 13.6 11.7 - 15.5 g/dL  ? HCT 38.8 35.0 - 45.0 %  ? MCV 93.9 80.0 - 100.0 fL  ? MCH 32.9 27.0 - 33.0 pg  ? MCHC 35.1 32.0 - 36.0 g/dL  ? RDW 12.0 11.0 - 15.0 %  ? Platelets 287 140 - 400 Thousand/uL  ? MPV 10.8 7.5 - 12.5 fL  ? Neutro Abs 6,296 1,500 - 7,800 cells/uL  ? Lymphs Abs 3,499 850 - 3,900 cells/uL  ? Absolute Monocytes 864 200 - 950 cells/uL  ? Eosinophils Absolute 86 15 - 500 cells/uL  ? Basophils Absolute 54 0 - 200 cells/uL  ? Neutrophils  Relative % 58.3 %  ? Total Lymphocyte 32.4 %  ? Monocytes Relative 8.0 %  ? Eosinophils Relative 0.8 %  ? Basophils Relative 0.5 %  ?COMPLETE METABOLIC PANEL WITH GFR  ?Result Value Ref Range  ? Glucose, Bld 78 65 - 99 mg/dL  ? BUN 10 7 - 25 mg/dL  ? Creat 0.74 0.50 - 1.10 mg/dL  ? GFR, Est Non African American 104 > OR = 60 mL/min/1.74m2  ? GFR, Est African American 121 > OR = 60 mL/min/1.67m2  ? BUN/Creatinine Ratio NOT APPLICABLE 6 - 22 (calc)  ? Sodium 142 135 - 146 mmol/L  ? Potassium 3.7 3.5 - 5.3 mmol/L  ? Chloride 107 98 - 110 mmol/L  ? CO2 25 20 - 32 mmol/L  ? Calcium 9.0 8.6 - 10.2 mg/dL  ? Total Protein 6.5 6.1 - 8.1 g/dL  ? Albumin 4.2 3.6 - 5.1 g/dL  ? Globulin 2.3 1.9 - 3.7 g/dL (calc)  ? AG Ratio 1.8 1.0 - 2.5 (calc)  ? Total Bilirubin 0.3 0.2 - 1.2 mg/dL  ? Alkaline phosphatase (APISO) 51 31 - 125 U/L  ? AST 17 10 - 30 U/L  ? ALT 12 6 - 29 U/L  ?TSH + free T4  ?Result Value Ref Range  ? TSH W/REFLEX TO FT4 0.89 mIU/L  ?Insulin, Free (Bioactive)  ?Result Value Ref Range  ? Insulin, Free 4.8 1.5 - 14.9 uIU/mL  ? ?   ?Assessment & Plan:  ? ?Problem List Items Addressed This Visit   ? ?  ? Other  ? Depression, recurrent ([place])  ?  Chronic.  Improved from prior with the Zoloft.  Patient agrees with increasing to 100mg  daily.  Will send new prescription to the pharmacy for patient. Follow up in 1 month for reevaluation.  ?  ?  ? Relevant Medications  ? sertraline (ZOLOFT) 100 MG tablet  ? Anxiety  ?  Chronic.  Improved from prior with the Zoloft.  Patient agrees with increasing to 100mg  daily.  Will send new prescription to the pharmacy for patient. Follow up in 1 month for reevaluation.  ?  ?  ?  Relevant Medications  ? sertraline (ZOLOFT) 100 MG tablet  ?  ? ?Follow up plan: ?Return in about 1 month (around 12/29/2021) for Physical and Fasting labs. ? ? ? ? ? ?

## 2021-12-20 ENCOUNTER — Other Ambulatory Visit: Payer: Self-pay | Admitting: Nurse Practitioner

## 2021-12-23 ENCOUNTER — Ambulatory Visit: Payer: Managed Care, Other (non HMO) | Admitting: Nurse Practitioner

## 2021-12-23 NOTE — Telephone Encounter (Signed)
Requested medications are due for refill today.  yes ? ?Requested medications are on the active medications list.  yes ? ?Last refill. 11/28/2021 #30 0 refills ? ?Future visit scheduled.   yes ? ?Notes to clinic.  Medication not delegated. ? ? ? ?Requested Prescriptions  ?Pending Prescriptions Disp Refills  ? sertraline (ZOLOFT) 100 MG tablet [Pharmacy Med Name: SERTRALINE HCL 100 MG TABLET] 90 tablet 1  ?  Sig: TAKE 1 TABLET BY MOUTH EVERY DAY  ?  ? Not Delegated - Psychiatry:  Antidepressants - SSRI - sertraline Failed - 12/20/2021 10:32 AM  ?  ?  Failed - This refill cannot be delegated  ?  ?  Failed - AST in normal range and within 360 days  ?  AST  ?Date Value Ref Range Status  ?09/17/2020 17 10 - 30 U/L Final  ?  ?  ?  ?  Failed - ALT in normal range and within 360 days  ?  ALT  ?Date Value Ref Range Status  ?09/17/2020 12 6 - 29 U/L Final  ?  ?  ?  ?  Passed - Completed PHQ-2 or PHQ-9 in the last 360 days  ?  ?  Passed - Valid encounter within last 6 months  ?  Recent Outpatient Visits   ? ?      ? 3 weeks ago Anxiety  ? [facility] Larae Grooms, NP  ? 1 month ago Depression, recurrent (HCC)  ? [facility] Larae Grooms, NP  ? 1 year ago Annual physical exam  ? [facility], Jodelle Gross, FNP  ? 1 year ago Esophageal spasm  ? [facility], Jodelle Gross, FNP  ? 1 year ago Acute bilateral low back pain without sciatica  ? [facility], Jodelle Gross, FNP  ? ?  ?  ?Future Appointments   ? ?        ? In 1 week Larae Grooms, NP [facility]  ? ?  ? ?  ?  ?  ?  ?

## 2021-12-26 ENCOUNTER — Encounter: Payer: Self-pay | Admitting: Nurse Practitioner

## 2022-01-01 NOTE — Progress Notes (Signed)
? ?BP 102/66   Pulse 84   Temp 98.4 ?F (36.9 ?C) (Oral)   Ht 5\' 5"  (1.651 m)   Wt 183 lb 6.4 oz (83.2 kg)   LMP 12/23/2021 (Exact Date)   SpO2 98%   BMI 30.52 kg/m?   ? ?Subjective:  ? ? Patient ID: Kimberly Neal, female    DOB: 10-Jul-1986, 36 y.o.   MRN: [id number] ? ?HPI: ?Kimberly Neal is a 36 y.o. female presenting on 01/03/2022 for comprehensive medical examination. Current medical complaints include:none ? ?She currently lives with: ?Menopausal Symptoms: no ? ? Denies HA, CP, SOB, dizziness, palpitations, visual changes, and lower extremity swelling. ? ?DEPRESSION/ANXIETY ?Patient states she has taken herself off the Zoloft.  She was having night sweats with the medication.  Does not feel like she needs any at this time.  Denies SI.  ? ? ?Depression Screen done today and results listed below:  ? ?  01/03/2022  ?  8:47 AM 11/28/2021  ?  3:07 PM 11/06/2021  ?  1:18 PM 09/19/2019  ?  1:38 PM 11/21/2016  ?  2:08 PM  ?Depression screen PHQ 2/9  ?Decreased Interest 0 0 2 0 0  ?Down, Depressed, Hopeless 0 1 2 0 0  ?PHQ - 2 Score 0 1 4 0 0  ?Altered sleeping 1 2 1     ?Tired, decreased energy 1 2 1     ?Change in appetite 0 0 0    ?Feeling bad or failure about yourself  0 1 1    ?Trouble concentrating 1 1 0    ?Moving slowly or fidgety/restless 0 0 0    ?Suicidal thoughts 0 0 0    ?PHQ-9 Score 3 7 7     ?Difficult doing work/chores Not difficult at all Somewhat difficult Somewhat difficult    ? ? ?The patient does not have a history of falls. I did complete a risk assessment for falls. A plan of care for falls was documented. ? ? ?Past Medical History:  ?Past Medical History:  ?Diagnosis Date  ? DDD (degenerative disc disease), lumbar   ? HPV in female   ? Smoker   ? UTI (urinary tract infection)   ? ? ?Surgical History:  ?Past Surgical History:  ?Procedure Laterality Date  ? TONSILLECTOMY AND ADENOIDECTOMY    ? TUBAL LIGATION  01/10/2018  ? ? ?Medications:  ?No current outpatient medications on file prior to  visit.  ? ?No current facility-administered medications on file prior to visit.  ? ? ?Allergies:  ?No Known Allergies ? ?Social History:  ?Social History  ? ?Socioeconomic History  ? Marital status: Married  ?  Spouse name: Not on file  ? Number of children: Not on file  ? Years of education: Not on file  ? Highest education level: Not on file  ?Occupational History  ? Not on file  ?Tobacco Use  ? Smoking status: Every Day  ?  Packs/day: 0.50  ?  Types: Cigarettes  ? Smokeless tobacco: Never  ?Vaping Use  ? Vaping Use: Never used  ?Substance and Sexual Activity  ? Alcohol use: Not Currently  ? Drug use: No  ? Sexual activity: Yes  ?Other Topics Concern  ? Not on file  ?Social History Narrative  ? Not on file  ? ?Social Determinants of Health  ? ?Financial Resource Strain: Not on file  ?Food Insecurity: Not on file  ?Transportation Needs: Not on file  ?Physical Activity: Not on file  ?Stress: Not on file  ?  Social Connections: Not on file  ?Intimate Partner Violence: Not on file  ? ?Social History  ? ?Tobacco Use  ?Smoking Status Every Day  ? Packs/day: 0.50  ? Types: Cigarettes  ?Smokeless Tobacco Never  ? ?Social History  ? ?Substance and Sexual Activity  ?Alcohol Use Not Currently  ? ? ?Family History:  ?Family History  ?Problem Relation Age of Onset  ? COPD Father   ? Leukemia Father   ? Hypertension Brother   ? Hyperlipidemia Brother   ? Asthma Son   ? Heart disease Maternal Grandmother   ? Diabetes Maternal Grandmother   ? Lung cancer Maternal Grandmother   ? Lung disease Maternal Grandfather   ? Kidney disease Paternal Grandmother   ? Diabetes Paternal Grandmother   ? Leukemia Paternal Grandfather   ? ? ?Past medical history, surgical history, medications, allergies, family history and social history reviewed with patient today and changes made to appropriate areas of the chart.  ? ?Review of Systems  ?Eyes:  Negative for blurred vision and double vision.  ?Respiratory:  Negative for shortness of breath.    ?Cardiovascular:  Negative for chest pain, palpitations and leg swelling.  ?Neurological:  Negative for dizziness and headaches.  ?All other ROS negative except what is listed above and in the HPI.  ? ?   ?Objective:  ?  ?BP 102/66   Pulse 84   Temp 98.4 ?F (36.9 ?C) (Oral)   Ht 5\' 5"  (1.651 m)   Wt 183 lb 6.4 oz (83.2 kg)   LMP 12/23/2021 (Exact Date)   SpO2 98%   BMI 30.52 kg/m?   ?Wt Readings from Last 3 Encounters:  ?01/03/22 183 lb 6.4 oz (83.2 kg)  ?11/28/21 182 lb 9.6 oz (82.8 kg)  ?11/06/21 182 lb (82.6 kg)  ?  ?Physical Exam ?Vitals and nursing note reviewed.  ?Constitutional:   ?   General: She is awake. She is not in acute distress. ?   Appearance: Normal appearance. She is well-developed and normal weight. She is not ill-appearing.  ?HENT:  ?   Head: Normocephalic and atraumatic.  ?   Right Ear: Hearing, tympanic membrane, ear canal and external ear normal. No drainage.  ?   Left Ear: Hearing, tympanic membrane, ear canal and external ear normal. No drainage.  ?   Nose: Nose normal.  ?   Right Sinus: No maxillary sinus tenderness or frontal sinus tenderness.  ?   Left Sinus: No maxillary sinus tenderness or frontal sinus tenderness.  ?   Mouth/Throat:  ?   Mouth: Mucous membranes are moist.  ?   Pharynx: Oropharynx is clear. Uvula midline. No pharyngeal swelling, oropharyngeal exudate or posterior oropharyngeal erythema.  ?Eyes:  ?   General: Lids are normal.     ?   Right eye: No discharge.     ?   Left eye: No discharge.  ?   Extraocular Movements: Extraocular movements intact.  ?   Conjunctiva/sclera: Conjunctivae normal.  ?   Pupils: Pupils are equal, round, and reactive to light.  ?   Visual Fields: Right eye visual fields normal and left eye visual fields normal.  ?Neck:  ?   Thyroid: No thyromegaly.  ?   Vascular: No carotid bruit.  ?   Trachea: Trachea normal.  ?Cardiovascular:  ?   Rate and Rhythm: Normal rate and regular rhythm.  ?   Heart sounds: Normal heart sounds. No murmur heard. ?   No gallop.  ?Pulmonary:  ?  Effort: Pulmonary effort is normal. No accessory muscle usage or respiratory distress.  ?   Breath sounds: Normal breath sounds.  ?Chest:  ?Breasts: ?   Right: Normal.  ?   Left: Normal.  ?Abdominal:  ?   General: Bowel sounds are normal.  ?   Palpations: Abdomen is soft. There is no hepatomegaly or splenomegaly.  ?   Tenderness: There is no abdominal tenderness.  ?Musculoskeletal:     ?   General: Normal range of motion.  ?   Cervical back: Normal range of motion and neck supple.  ?   Right lower leg: No edema.  ?   Left lower leg: No edema.  ?Lymphadenopathy:  ?   Head:  ?   Right side of head: No submental, submandibular, tonsillar, preauricular or posterior auricular adenopathy.  ?   Left side of head: No submental, submandibular, tonsillar, preauricular or posterior auricular adenopathy.  ?   Cervical: No cervical adenopathy.  ?   Upper Body:  ?   Right upper body: No supraclavicular, axillary or pectoral adenopathy.  ?   Left upper body: No supraclavicular, axillary or pectoral adenopathy.  ?Skin: ?   General: Skin is warm and dry.  ?   Capillary Refill: Capillary refill takes less than 2 seconds.  ?   Findings: No rash.  ?Neurological:  ?   Mental Status: She is alert and oriented to person, place, and time.  ?   Gait: Gait is intact.  ?   Deep Tendon Reflexes: Reflexes are normal and symmetric.  ?   Reflex Scores: ?     Brachioradialis reflexes are 2+ on the right side and 2+ on the left side. ?     Patellar reflexes are 2+ on the right side and 2+ on the left side. ?Psychiatric:     ?   Attention and Perception: Attention normal.     ?   Mood and Affect: Mood normal.     ?   Speech: Speech normal.     ?   Behavior: Behavior normal. Behavior is cooperative.     ?   Thought Content: Thought content normal.     ?   Judgment: Judgment normal.  ? ? ?Results for orders placed or performed in visit on 09/17/20  ?CBC with Differential  ?Result Value Ref Range  ? WBC 10.8 3.8 - 10.8  Thousand/uL  ? RBC 4.13 3.80 - 5.10 Million/uL  ? Hemoglobin 13.6 11.7 - 15.5 g/dL  ? HCT 38.8 35.0 - 45.0 %  ? MCV 93.9 80.0 - 100.0 fL  ? MCH 32.9 27.0 - 33.0 pg  ? MCHC 35.1 32.0 - 36.0 g/dL  ? RDW 12.0 11.0 - 15.0 %  ?

## 2022-01-03 ENCOUNTER — Encounter: Payer: Self-pay | Admitting: Nurse Practitioner

## 2022-01-03 ENCOUNTER — Ambulatory Visit (INDEPENDENT_AMBULATORY_CARE_PROVIDER_SITE_OTHER): Payer: BC Managed Care – PPO | Admitting: Nurse Practitioner

## 2022-01-03 VITALS — BP 102/66 | HR 84 | Temp 98.4°F | Ht 65.0 in | Wt 183.4 lb

## 2022-01-03 DIAGNOSIS — Z136 Encounter for screening for cardiovascular disorders: Secondary | ICD-10-CM

## 2022-01-03 DIAGNOSIS — Z0000 Encounter for general adult medical examination without abnormal findings: Secondary | ICD-10-CM

## 2022-01-03 DIAGNOSIS — F419 Anxiety disorder, unspecified: Secondary | ICD-10-CM | POA: Diagnosis not present

## 2022-01-03 DIAGNOSIS — F339 Major depressive disorder, recurrent, unspecified: Secondary | ICD-10-CM

## 2022-01-03 DIAGNOSIS — Z1159 Encounter for screening for other viral diseases: Secondary | ICD-10-CM

## 2022-01-03 DIAGNOSIS — R8290 Unspecified abnormal findings in urine: Secondary | ICD-10-CM

## 2022-01-03 LAB — URINALYSIS, ROUTINE W REFLEX MICROSCOPIC
Bilirubin, UA: NEGATIVE
Glucose, UA: NEGATIVE
Ketones, UA: NEGATIVE
Nitrite, UA: NEGATIVE
Protein,UA: NEGATIVE
RBC, UA: NEGATIVE
Specific Gravity, UA: 1.01 (ref 1.005–1.030)
Urobilinogen, Ur: 0.2 mg/dL (ref 0.2–1.0)
pH, UA: 7 (ref 5.0–7.5)

## 2022-01-03 LAB — MICROSCOPIC EXAMINATION
Bacteria, UA: NONE SEEN
RBC, Urine: NONE SEEN /hpf (ref 0–2)

## 2022-01-03 NOTE — Assessment & Plan Note (Signed)
Chronic.  Controlled without medication.  Feels like it is a seasonal issue. Doing much better now.  Labs ordered today.  Return to clinic in 1 year for reevaluation.  Call sooner if concerns arise.  ? ?

## 2022-01-03 NOTE — Assessment & Plan Note (Signed)
Chronic.  Controlled without medication.  Feels like it is a seasonal issue. Doing much better now.  Labs ordered today.  Return to clinic in 1 year for reevaluation.  Call sooner if concerns arise.  ? ?

## 2022-01-03 NOTE — Addendum Note (Signed)
Addended by: Larae Grooms on: 01/03/2022 09:21 AM ? ? Modules accepted: Orders ? ?

## 2022-01-04 LAB — CBC WITH DIFFERENTIAL/PLATELET
Basophils Absolute: 0.1 10*3/uL (ref 0.0–0.2)
Basos: 1 %
EOS (ABSOLUTE): 0.1 10*3/uL (ref 0.0–0.4)
Eos: 1 %
Hematocrit: 40 % (ref 34.0–46.6)
Hemoglobin: 13.6 g/dL (ref 11.1–15.9)
Immature Grans (Abs): 0.1 10*3/uL (ref 0.0–0.1)
Immature Granulocytes: 1 %
Lymphocytes Absolute: 3 10*3/uL (ref 0.7–3.1)
Lymphs: 23 %
MCH: 31.8 pg (ref 26.6–33.0)
MCHC: 34 g/dL (ref 31.5–35.7)
MCV: 94 fL (ref 79–97)
Monocytes Absolute: 0.9 10*3/uL (ref 0.1–0.9)
Monocytes: 7 %
Neutrophils Absolute: 8.9 10*3/uL — ABNORMAL HIGH (ref 1.4–7.0)
Neutrophils: 67 %
Platelets: 245 10*3/uL (ref 150–450)
RBC: 4.28 x10E6/uL (ref 3.77–5.28)
RDW: 12.1 % (ref 11.7–15.4)
WBC: 12.9 10*3/uL — ABNORMAL HIGH (ref 3.4–10.8)

## 2022-01-04 LAB — COMPREHENSIVE METABOLIC PANEL
ALT: 18 IU/L (ref 0–32)
AST: 18 IU/L (ref 0–40)
Albumin/Globulin Ratio: 2.4 — ABNORMAL HIGH (ref 1.2–2.2)
Albumin: 4.3 g/dL (ref 3.8–4.8)
Alkaline Phosphatase: 55 IU/L (ref 44–121)
BUN/Creatinine Ratio: 13 (ref 9–23)
BUN: 9 mg/dL (ref 6–20)
Bilirubin Total: <0.2 mg/dL (ref 0.0–1.2)
CO2: 23 mmol/L (ref 20–29)
Calcium: 8.7 mg/dL (ref 8.7–10.2)
Chloride: 103 mmol/L (ref 96–106)
Creatinine, Ser: 0.67 mg/dL (ref 0.57–1.00)
Globulin, Total: 1.8 g/dL (ref 1.5–4.5)
Glucose: 93 mg/dL (ref 70–99)
Potassium: 4.1 mmol/L (ref 3.5–5.2)
Sodium: 140 mmol/L (ref 134–144)
Total Protein: 6.1 g/dL (ref 6.0–8.5)
eGFR: 115 mL/min/{1.73_m2} (ref >59)

## 2022-01-04 LAB — HEPATITIS C ANTIBODY: Hep C Virus Ab: NONREACTIVE

## 2022-01-04 LAB — LIPID PANEL
Chol/HDL Ratio: 4 ratio (ref 0.0–4.4)
Cholesterol, Total: 171 mg/dL (ref 100–199)
HDL: 43 mg/dL (ref >39)
LDL Chol Calc (NIH): 123 mg/dL — ABNORMAL HIGH (ref 0–99)
Triglycerides: 23 mg/dL (ref 0–149)
VLDL Cholesterol Cal: 5 mg/dL (ref 5–40)

## 2022-01-04 LAB — TSH: TSH: 0.288 u[IU]/mL — ABNORMAL LOW (ref 0.450–4.500)

## 2022-01-06 ENCOUNTER — Telehealth: Payer: Self-pay | Admitting: Nurse Practitioner

## 2022-01-06 NOTE — Telephone Encounter (Signed)
Routing to provider to advise on labs as they have not been resulted by the provider.  ?

## 2022-01-06 NOTE — Telephone Encounter (Signed)
Copied from [place] [phone]. Topic: General - Call Back - No Documentation ?>> Jan 06, 2022  1:47 PM Kimberly Neal wrote: ?Reason for CRM: Pt wants to discuss her lab results, please advise if the [facility] may disclose.  ? ?Best contact: [phone] ?

## 2022-01-07 ENCOUNTER — Other Ambulatory Visit: Payer: Self-pay | Admitting: Family Medicine

## 2022-01-07 DIAGNOSIS — R7989 Other specified abnormal findings of blood chemistry: Secondary | ICD-10-CM

## 2022-01-07 LAB — URINE CULTURE

## 2022-01-07 MED ORDER — NITROFURANTOIN MONOHYD MACRO 100 MG PO CAPS: 100.0000 mg | ORAL_CAPSULE | Freq: Two times a day (BID) | ORAL | 0 refills | Status: DC

## 2022-01-07 NOTE — Telephone Encounter (Signed)
See result note.  

## 2022-01-07 NOTE — Telephone Encounter (Signed)
Results have been resulted

## 2022-01-28 ENCOUNTER — Encounter: Payer: Self-pay | Admitting: Nurse Practitioner

## 2022-02-05 ENCOUNTER — Telehealth: Payer: BC Managed Care – PPO

## 2022-02-05 ENCOUNTER — Ambulatory Visit (INDEPENDENT_AMBULATORY_CARE_PROVIDER_SITE_OTHER): Payer: BC Managed Care – PPO | Admitting: Internal Medicine

## 2022-02-05 ENCOUNTER — Encounter: Payer: Self-pay | Admitting: Internal Medicine

## 2022-02-05 ENCOUNTER — Ambulatory Visit: Payer: Self-pay

## 2022-02-05 VITALS — BP 113/79 | HR 89 | Temp 98.7°F | Ht 65.0 in | Wt 181.2 lb

## 2022-02-05 DIAGNOSIS — F419 Anxiety disorder, unspecified: Secondary | ICD-10-CM

## 2022-02-05 DIAGNOSIS — R7989 Other specified abnormal findings of blood chemistry: Secondary | ICD-10-CM | POA: Diagnosis not present

## 2022-02-05 DIAGNOSIS — R002 Palpitations: Secondary | ICD-10-CM

## 2022-02-05 MED ORDER — HYDROXYZINE PAMOATE 25 MG PO CAPS: 25.0000 mg | ORAL_CAPSULE | Freq: Three times a day (TID) | ORAL | 0 refills | Status: DC | PRN

## 2022-02-05 NOTE — Telephone Encounter (Signed)
? ? ?  Chief Complaint: Feeling anxious, panic ?Symptoms: Above ?Frequency: 1 week ago, getting worse ?Pertinent Negatives: Patient denies any thoughts of self-harm. ?Disposition: [] ED /[] Urgent Care (no appt availability in office) / [x] Appointment(In office/virtual)/ []  [place] [facility]/ [] Home Care/ [] Refused Recommended Disposition /[] [place] [facility]/ []  Follow-up with PCP ?Additional Notes: Instructed to call back if symptoms worsen.  ?Reason for Disposition ? Symptoms interfere with work or school ? ?Answer Assessment - Initial Assessment Questions ?1. CONCERN: "Did anything happen that prompted you to call today?"  ?    Anxiety ?2. ANXIETY SYMPTOMS: "Can you describe how you (your loved one; patient) have been feeling?" (e.g., tense, restless, panicky, anxious, keyed up, overwhelmed, sense of impending doom).  ?    Panic ?3. ONSET: "How long have you been feeling this way?" (e.g., hours, days, weeks) ?    1 week ago ?4. SEVERITY: "How would you rate the level of anxiety?" (e.g., 0 - 10; or mild, moderate, severe). ?    Severe ?5. FUNCTIONAL IMPAIRMENT: "How have these feelings affected your ability to do daily activities?" "Have you had more difficulty than usual doing your normal daily activities?" (e.g., getting better, same, worse; self-care, school, work, interactions) ?    Daily activities ?6. HISTORY: "Have you felt this way before?" "Have you ever been diagnosed with an anxiety problem in the past?" (e.g., generalized anxiety disorder, panic attacks, PTSD). If Yes, ask: "How was this problem treated?" (e.g., medicines, counseling, etc.) ?    Yes ?7. RISK OF HARM - SUICIDAL IDEATION: "Do you ever have thoughts of hurting or killing yourself?" If Yes, ask:  "Do you have these feelings now?" "Do you have a plan on how you would do this?" ?    No ?8. TREATMENT:  "What has been done so far to treat this anxiety?" (e.g., medicines, relaxation strategies). "What has helped?" ?    Had been on  medications ?9. TREATMENT - THERAPIST: "Do you have a counselor or therapist? Name?" ?    No ?10. POTENTIAL TRIGGERS: "Do you drink caffeinated beverages (e.g., coffee, colas, teas), and how much daily?" "Do you drink alcohol or use any drugs?" "Have you started any new medicines recently?" ?    No ?10. PATIENT SUPPORT: "Who is with you now?" "Who do you live with?" "Do you have family or friends who you can talk to?"  ?      Family ?11. OTHER SYMPTOMS: "Do you have any other symptoms?" (e.g., feeling depressed, trouble concentrating, trouble sleeping, trouble breathing, palpitations or fast heartbeat, chest pain, sweating, nausea, or diarrhea) ?      No ?12. PREGNANCY: "Is there any chance you are pregnant?" "When was your last menstrual period?" ?      NO ? ?Protocols used: Anxiety and Panic Attack-A-AH ? ?

## 2022-02-05 NOTE — Progress Notes (Signed)
? ?BP 113/79   Pulse 89   Temp 98.7 ?F (37.1 ?C) (Oral)   Ht 5\' 5"  (1.651 m)   Wt 181 lb 3.2 oz (82.2 kg)   SpO2 99%   BMI 30.15 kg/m?   ? ?Subjective:  ? ? Patient ID: Kimberly Neal, female    DOB: 05/15/1986, 36 y.o.   MRN: [id number] ? ?Chief Complaint  ?Patient presents with  ? Anxiety  ? ? ?HPI: ?Kimberly Neal is a 36 y.o. female ? ?Anxiety ?Presents for initial (last week and a half has been very short nad stressed out, was on the verge of a panic attack, had to leave work. has had anxiety before. but hasnt lasted this long, the inability to calm herself down. nothing has changed in life per he rno new stressors) visit. Episode onset: has been off of zoloft now x mid march, was only this for a month and a half. Symptoms include dizziness, excessive worry, hyperventilation, irritability, nervous/anxious behavior, palpitations, panic and restlessness. Patient reports no chest pain, compulsions, confusion, decreased concentration, depressed mood, dry mouth, feeling of choking, insomnia, nausea, shortness of breath or suicidal ideas. The severity of symptoms is interfering with daily activities.  ? ? ?Thyroid Problem ?Presents for initial visit. Symptoms include anxiety, cold intolerance, constipation, diaphoresis, diarrhea, dry skin, hair loss, menstrual problem, palpitations, visual change and weight loss. Patient reports no depressed mood, fatigue, heat intolerance, tremors or weight gain. (Has a 38 yr old but hasnt had any more ? ?Weight loss of 40 lbs has been trying via keto diet ? ?Period was 10 days long last month.  ?Has noticed visual changes -Cannot read up close seeing new now ) There is no history of hyperlipidemia.  ?Palpitations  ?This is a new problem. Associated symptoms include anxiety, diaphoresis and dizziness. Pertinent negatives include no chest pain, nausea or shortness of breath.  ? ?Chief Complaint  ?Patient presents with  ? Anxiety  ? ? ?Relevant past medical, surgical,  family and social history reviewed and updated as indicated. Interim medical history since our last visit reviewed. ?Allergies and medications reviewed and updated. ? ?Review of Systems  ?Constitutional:  Positive for diaphoresis, irritability and weight loss. Negative for fatigue and weight gain.  ?Respiratory:  Negative for shortness of breath.   ?Cardiovascular:  Positive for palpitations. Negative for chest pain.  ?Gastrointestinal:  Positive for constipation and diarrhea. Negative for nausea.  ?Endocrine: Positive for cold intolerance. Negative for heat intolerance.  ?Genitourinary:  Positive for menstrual problem.  ?Neurological:  Positive for dizziness. Negative for tremors.  ?Psychiatric/Behavioral:  Negative for confusion, decreased concentration and suicidal ideas. The patient is nervous/anxious. The patient does not have insomnia.   ? ?Per HPI unless specifically indicated above ? ?   ?Objective:  ?  ?BP 113/79   Pulse 89   Temp 98.7 ?F (37.1 ?C) (Oral)   Ht 5\' 5"  (1.651 m)   Wt 181 lb 3.2 oz (82.2 kg)   SpO2 99%   BMI 30.15 kg/m?   ?Wt Readings from Last 3 Encounters:  ?02/05/22 181 lb 3.2 oz (82.2 kg)  ?01/03/22 183 lb 6.4 oz (83.2 kg)  ?11/28/21 182 lb 9.6 oz (82.8 kg)  ?  ?Physical Exam ?Vitals and nursing note reviewed.  ?Constitutional:   ?   General: She is not in acute distress. ?   Appearance: Normal appearance. She is not ill-appearing or diaphoretic.  ?Eyes:  ?   Conjunctiva/sclera: Conjunctivae normal.  ?Cardiovascular:  ?  Rate and Rhythm: Normal rate and regular rhythm.  ?   Heart sounds: No murmur heard. ?Pulmonary:  ?   Effort: No respiratory distress.  ?   Breath sounds: No stridor. No wheezing or rhonchi.  ?Abdominal:  ?   General: Abdomen is flat. Bowel sounds are normal. There is no distension.  ?   Palpations: Abdomen is soft. There is no mass.  ?   Tenderness: There is no abdominal tenderness. There is no guarding.  ?Skin: ?   General: Skin is warm and dry.  ?   Coloration:  Skin is not jaundiced.  ?   Findings: No erythema.  ?Neurological:  ?   Mental Status: She is alert.  ?Psychiatric:  ?   Comments: Pt is tearful and very anxious  ? ? ?Results for orders placed or performed in visit on 01/03/22  ?HM PAP SMEAR  ?Result Value Ref Range  ? HM Pap smear sEE SCANNED RESULTS IN CHART   ?Results Console HPV  ?Result Value Ref Range  ? CHL HPV Positive   ? ?   ? ? ?Current Outpatient Medications:  ?  hydrOXYzine (VISTARIL) 25 MG capsule, Take 1 capsule (25 mg total) by mouth every 8 (eight) hours as needed., Disp: 30 capsule, Rfl: 0  ? ? ?Assessment & Plan:  ?Low TSH : ? Sec to hyperthyroidism will need to repeat labs checked about 3 weeks ago and was abnl.  ?- will need to check TSH / FT4  ?- Will start pt on hydroxyzine. Declines zoloft at this time which she was on this anxiety. ?Fu with pcp x 1 week ? ?Problem List Items Addressed This Visit   ? ?  ? Other  ? Anxiety  ? Relevant Medications  ? hydrOXYzine (VISTARIL) 25 MG capsule  ? Low serum thyroid stimulating hormone (TSH) - Primary  ? Relevant Orders  ? TSH  ? T4, free  ? Palpitations  ?  ? ?Orders Placed This Encounter  ?Procedures  ? TSH  ? T4, free  ?  ? ?Meds ordered this encounter  ?Medications  ? hydrOXYzine (VISTARIL) 25 MG capsule  ?  Sig: Take 1 capsule (25 mg total) by mouth every 8 (eight) hours as needed.  ?  Dispense:  30 capsule  ?  Refill:  0  ?  ? ?Follow up plan: ?Return in about 1 week (around 02/12/2022). ? ? ?

## 2022-02-06 LAB — TSH: TSH: 0.367 u[IU]/mL — ABNORMAL LOW (ref 0.450–4.500)

## 2022-02-06 LAB — T4, FREE: Free T4: 1.27 ng/dL (ref 0.82–1.77)

## 2022-02-07 ENCOUNTER — Other Ambulatory Visit: Payer: BC Managed Care – PPO

## 2022-02-07 ENCOUNTER — Ambulatory Visit: Payer: Self-pay

## 2022-02-07 NOTE — Telephone Encounter (Signed)
Called patient and informed her of Dr. Gwynneth Albright recommendation to take 1/2 of the medication prescribed and to only take it while at home or in the evening. Also that she should follow up with her PCP. Patient verbalized understand and has follow up with Clydie Braun next week per pateint ?

## 2022-02-07 NOTE — Telephone Encounter (Signed)
Take 1/2 pill as needed and use while at home / in the evenings. To fu with pcp regarding other chronic issues which could be causing her anxiety

## 2022-02-07 NOTE — Telephone Encounter (Signed)
?  Chief Complaint: vistaril is too strong and wants the dose or med changed ?Symptoms: stated it oversedated her ?Frequency: yesterday ?Pertinent Negatives: Patient denies no other side effects ?Disposition: [] ED /[] Urgent Care (no appt availability in office) / [] Appointment(In office/virtual)/ []  [facility]/ [] Home Care/ [] Refused Recommended Disposition /[] [place]/ [x]  Follow-up with PCP ?Additional Notes: Advised pt to go home once she is grounded and able to drive. Need to send a work excuse for today. Pt did not take Vistaril and is panicky. Advised pt will share with PCP and to keep phone close. Pt verbalized understadnnig. ? ? ? ? ? ?Reason for Disposition ? [1] Caller has URGENT medicine question about med that PCP or specialist prescribed AND [2] triager unable to answer question ? ?Answer Assessment - Initial Assessment Questions ?1. NAME of MEDICATION: "What medicine are you calling about?" ?    vistaril ?2. QUESTION: "What is your question?" (e.g., double dose of medicine, side effect) ?    Helps anxiety but she is unable to work on this dose. ?3. PRESCRIBING HCP: "Who prescribed it?" Reason: if prescribed by specialist, call should be referred to that group. ?    PCP ?4. SYMPTOMS: "Do you have any symptoms?" ?    Pt stated she took the Vistaril yest am and she was a "zombie" the rest of the day ?Pt did not take today and is feeling anxious nd panicky. ?5. SEVERITY: If symptoms are present, ask "Are they mild, moderate or severe?" ?    severe ? ?Protocols used: Medication Question Call-A-AH ? ?

## 2022-02-13 ENCOUNTER — Encounter: Payer: Self-pay | Admitting: Nurse Practitioner

## 2022-02-13 ENCOUNTER — Ambulatory Visit (INDEPENDENT_AMBULATORY_CARE_PROVIDER_SITE_OTHER): Payer: BC Managed Care – PPO | Admitting: Nurse Practitioner

## 2022-02-13 VITALS — BP 108/70 | HR 68 | Temp 98.7°F | Wt 182.6 lb

## 2022-02-13 DIAGNOSIS — F419 Anxiety disorder, unspecified: Secondary | ICD-10-CM

## 2022-02-13 DIAGNOSIS — M62838 Other muscle spasm: Secondary | ICD-10-CM

## 2022-02-13 DIAGNOSIS — R7989 Other specified abnormal findings of blood chemistry: Secondary | ICD-10-CM | POA: Diagnosis not present

## 2022-02-13 MED ORDER — TRIAMCINOLONE ACETONIDE 40 MG/ML IJ SUSP
40.0000 mg | Freq: Once | INTRAMUSCULAR | Status: AC
  Administered 2022-02-13: 40 mg via INTRAMUSCULAR

## 2022-02-13 MED ORDER — BUSPIRONE HCL 5 MG PO TABS: 5.0000 mg | ORAL_TABLET | Freq: Two times a day (BID) | ORAL | 0 refills | Status: DC | PRN

## 2022-02-13 NOTE — Progress Notes (Signed)
BP 108/70   Pulse 68   Temp 98.7 F (37.1 C) (Oral)   Wt 182 lb 9.6 oz (82.8 kg)   LMP 02/13/2022   SpO2 98%   BMI 30.39 kg/m    Subjective:    Patient ID: Kimberly Neal, female    DOB: 1986/08/21, 36 y.o.   MRN: [id number]  HPI: Kimberly Neal is a 36 y.o. female  Chief Complaint  Patient presents with   Anxiety    1 week follow up ; hydroxyzine makes her drowsy would like to discuss options. Needs something for her panic attacks that usually happens during work hours. Also has a knot in the back of her head that would like to be addressed today    Hypothyroidism    1 week follow up     Patient states last week was a very bad week with anxiety and panic attacks.  This week has been a little bit better.  The hydroxyzine made her very sleepy.  It did help with anxiety.    Flowsheet Row Office Visit from 02/13/2022 in [facility]  PHQ-9 Total Score 13         02/13/2022    3:50 PM 02/05/2022    1:29 PM 01/03/2022    8:47 AM 11/28/2021    3:07 PM  GAD 7 : Generalized Anxiety Score  Nervous, Anxious, on Edge 3 3 1 1   Control/stop worrying 3 3 1 1   Worry too much - different things 3 3 1 1   Trouble relaxing 3 3 0 1  Restless 3 3 0 1  Easily annoyed or irritable 3 3 1 1   Afraid - awful might happen 2 3 1 1   Total GAD 7 Score 20 21 5 7   Anxiety Difficulty Somewhat difficult Very difficult Not difficult at all Somewhat difficult      Relevant past medical, surgical, family and social history reviewed and updated as indicated. Interim medical history since our last visit reviewed. Allergies and medications reviewed and updated.  Review of Systems  Psychiatric/Behavioral:  Negative for dysphoric mood and suicidal ideas. The patient is nervous/anxious.    Per HPI unless specifically indicated above     Objective:    BP 108/70   Pulse 68   Temp 98.7 F (37.1 C) (Oral)   Wt 182 lb 9.6 oz (82.8 kg)   LMP 02/13/2022   SpO2 98%   BMI 30.39  kg/m   Wt Readings from Last 3 Encounters:  02/13/22 182 lb 9.6 oz (82.8 kg)  02/05/22 181 lb 3.2 oz (82.2 kg)  01/03/22 183 lb 6.4 oz (83.2 kg)    Physical Exam Vitals and nursing note reviewed.  Constitutional:      General: She is not in acute distress.    Appearance: Normal appearance. She is normal weight. She is not ill-appearing, toxic-appearing or diaphoretic.  HENT:     Head: Normocephalic.     Right Ear: External ear normal.     Left Ear: External ear normal.     Nose: Nose normal.     Mouth/Throat:     Mouth: Mucous membranes are moist.     Pharynx: Oropharynx is clear.  Eyes:     General:        Right eye: No discharge.        Left eye: No discharge.     Extraocular Movements: Extraocular movements intact.     Conjunctiva/sclera: Conjunctivae normal.     Pupils: Pupils are  equal, round, and reactive to light.  Cardiovascular:     Rate and Rhythm: Normal rate and regular rhythm.     Heart sounds: No murmur heard. Pulmonary:     Effort: Pulmonary effort is normal. No respiratory distress.     Breath sounds: Normal breath sounds. No wheezing or rales.  Musculoskeletal:     Cervical back: Normal range of motion and neck supple.  Skin:    General: Skin is warm and dry.     Capillary Refill: Capillary refill takes less than 2 seconds.  Neurological:     General: No focal deficit present.     Mental Status: She is alert and oriented to person, place, and time. Mental status is at baseline.  Psychiatric:        Mood and Affect: Mood normal.        Behavior: Behavior normal.        Thought Content: Thought content normal.        Judgment: Judgment normal.    Results for orders placed or performed in visit on 02/05/22  TSH  Result Value Ref Range   TSH 0.367 (L) 0.450 - 4.500 uIU/mL  T4, free  Result Value Ref Range   Free T4 1.27 0.82 - 1.77 ng/dL      Assessment & Plan:   Problem List Items Addressed This Visit       Other   Anxiety   Relevant  Medications   busPIRone (BUSPAR) 5 MG tablet   Other Visit Diagnoses     Low TSH level    -  Primary   Tsh is low but T4 normal. Will refer to Endocrinology for further evaluation and management.   Relevant Orders   Ambulatory referral to Endocrinology   Muscle spasm       muscle spasm in neck. Will give kenalog in the office today. Will give flexeril to use at bedtime.  Side effects and benefits discussed. FU if not improved.   Relevant Medications   triamcinolone acetonide (KENALOG-40) injection 40 mg (Start on 02/13/2022  4:15 PM)        Follow up plan: Return in about 2 weeks (around 02/27/2022) for Depression/Anxiety FU (virtual).

## 2022-02-19 ENCOUNTER — Encounter: Payer: Self-pay | Admitting: Nurse Practitioner

## 2022-02-19 DIAGNOSIS — L680 Hirsutism: Secondary | ICD-10-CM

## 2022-02-27 ENCOUNTER — Encounter: Payer: Self-pay | Admitting: Nurse Practitioner

## 2022-02-27 ENCOUNTER — Telehealth (INDEPENDENT_AMBULATORY_CARE_PROVIDER_SITE_OTHER): Payer: BC Managed Care – PPO | Admitting: Nurse Practitioner

## 2022-02-27 DIAGNOSIS — F339 Major depressive disorder, recurrent, unspecified: Secondary | ICD-10-CM | POA: Diagnosis not present

## 2022-02-27 DIAGNOSIS — F419 Anxiety disorder, unspecified: Secondary | ICD-10-CM

## 2022-02-27 MED ORDER — BUSPIRONE HCL 5 MG PO TABS: 5.0000 mg | ORAL_TABLET | Freq: Two times a day (BID) | ORAL | 0 refills | Status: DC | PRN

## 2022-02-27 NOTE — Progress Notes (Unsigned)
LMP 02/13/2022    Subjective:    Patient ID: Kimberly Neal, female    DOB: [birth year], 36 y.o.   MRN: [id number]  HPI: Kimberly Neal is a 36 y.o. female  Chief Complaint  Patient presents with   Anxiety   Depression    2 week follow up - pt states she feels like her meds are working the times she is taking it     Patient states that things were going better but then she had an unexpected death in her family.  But before that she felt like she was doing better.  States that when she takes the Buspar she feels like it helps with her symptoms.     Flowsheet Row Video Visit from 02/27/2022 in [facility]  PHQ-9 Total Score 7         02/27/2022    4:11 PM 02/13/2022    3:50 PM 02/05/2022    1:29 PM 01/03/2022    8:47 AM  GAD 7 : Generalized Anxiety Score  Nervous, Anxious, on Edge 3 3 3 1   Control/stop worrying 1 3 3 1   Worry too much - different things 1 3 3 1   Trouble relaxing 3 3 3  0  Restless 2 3 3  0  Easily annoyed or irritable 1 3 3 1   Afraid - awful might happen 1 2 3 1   Total GAD 7 Score 12 20 21 5   Anxiety Difficulty Somewhat difficult Somewhat difficult Very difficult Not difficult at all      Relevant past medical, surgical, family and social history reviewed and updated as indicated. Interim medical history since our last visit reviewed. Allergies and medications reviewed and updated.  Review of Systems  Psychiatric/Behavioral:  Negative for dysphoric mood and suicidal ideas. The patient is nervous/anxious.    Per HPI unless specifically indicated above     Objective:    LMP 02/13/2022   Wt Readings from Last 3 Encounters:  02/13/22 182 lb 9.6 oz (82.8 kg)  02/05/22 181 lb 3.2 oz (82.2 kg)  01/03/22 183 lb 6.4 oz (83.2 kg)    Physical Exam Vitals and nursing note reviewed.  HENT:     Head: Normocephalic.     Right Ear: Hearing normal.     Left Ear: Hearing normal.     Nose: Nose normal.  Eyes:     Pupils: Pupils are equal,  round, and reactive to light.  Pulmonary:     Effort: Pulmonary effort is normal. No respiratory distress.  Neurological:     Mental Status: She is alert.  Psychiatric:        Mood and Affect: Mood normal.        Behavior: Behavior normal.        Thought Content: Thought content normal.        Judgment: Judgment normal.    Results for orders placed or performed in visit on 02/05/22  TSH  Result Value Ref Range   TSH 0.367 (L) 0.450 - 4.500 uIU/mL  T4, free  Result Value Ref Range   Free T4 1.27 0.82 - 1.77 ng/dL      Assessment & Plan:   Problem List Items Addressed This Visit       Other   Depression, recurrent (HCC) - Primary   Relevant Medications   busPIRone (BUSPAR) 5 MG tablet   Anxiety    Chronic. Not well controlled. Had a recent death in the family. Buspar was helping when she was taking  it PRN.  Will start medication scheduled BID.  Patient understands and agrees with the plan of care.  Follow up in 1 month for reevaluation.        Relevant Medications   busPIRone (BUSPAR) 5 MG tablet     Follow up plan: Return in about 1 month (around 03/29/2022) for Depression/Anxiety FU.   This visit was completed via MyChart due to the restrictions of the COVID-19 pandemic. All issues as above were discussed and addressed. Physical exam was done as above through visual confirmation on MyChart. If it was felt that the patient should be evaluated in the office, they were directed there. The patient verbally consented to this visit. Location of the patient: Home Location of the provider: Office Those involved with this call:  Provider: Larae Grooms, NP CMA: Anitra Lauth, CMA Front Desk/Registration: Servando Snare This encounter was conducted via video.  I spent 30 dedicated to the care of this patient on the date of this encounter to include previsit review of symptoms, medication change, plan of care moving forward, and follow up, face to face time with the patient, and  post visit ordering of testing.

## 2022-02-28 ENCOUNTER — Telehealth: Payer: Self-pay | Admitting: Nurse Practitioner

## 2022-02-28 NOTE — Telephone Encounter (Signed)
Copied from [place] [id number]. Topic: Referral - Status >> Feb 28, 2022  2:38 PM Leroy Kennedy R wrote: Reason for CRM: pt checking the status of referral, pt states when she called  [facility], she was told they do not have a referral for her. Please note when completed and contact pt directly

## 2022-02-28 NOTE — Assessment & Plan Note (Signed)
Chronic. Not well controlled. Had a recent death in the family. Buspar was helping when she was taking it PRN.  Will start medication scheduled BID.  Patient understands and agrees with the plan of care.  Follow up in 1 month for reevaluation.

## 2022-03-03 NOTE — Telephone Encounter (Signed)
Attempted to call patient again to give her this information and also to schedule follow up appointment with Santiago Glad in a month around 03/29/2022

## 2022-03-04 NOTE — Progress Notes (Signed)
3rd attempt to reach patient.

## 2022-04-29 ENCOUNTER — Encounter: Payer: Self-pay | Admitting: Nurse Practitioner

## 2022-05-06 ENCOUNTER — Emergency Department
Admission: EM | Admit: 2022-05-06 | Discharge: 2022-05-06 | Disposition: A | Discharge status: Home / Self Care | Payer: BC Managed Care – PPO | Attending: Emergency Medicine | Admitting: Emergency Medicine

## 2022-05-06 ENCOUNTER — Other Ambulatory Visit: Payer: Self-pay

## 2022-05-06 DIAGNOSIS — M62830 Muscle spasm of back: Secondary | ICD-10-CM | POA: Diagnosis not present

## 2022-05-06 DIAGNOSIS — M7918 Myalgia, other site: Secondary | ICD-10-CM | POA: Diagnosis present

## 2022-05-06 MED ORDER — OXYCODONE-ACETAMINOPHEN 5-325 MG PO TABS: 1.0000 | ORAL_TABLET | Freq: Four times a day (QID) | ORAL | 0 refills | Status: DC | PRN

## 2022-05-06 MED ORDER — ETODOLAC 400 MG PO TABS: 400.0000 mg | ORAL_TABLET | Freq: Two times a day (BID) | ORAL | 0 refills | Status: DC

## 2022-05-06 MED ORDER — KETOROLAC TROMETHAMINE 30 MG/ML IJ SOLN
30.0000 mg | Freq: Once | INTRAMUSCULAR | Status: AC
  Administered 2022-05-06: 30 mg via INTRAMUSCULAR
  Filled 2022-05-06: qty 1

## 2022-05-06 MED ORDER — METHOCARBAMOL 500 MG PO TABS: ORAL_TABLET | ORAL | 0 refills | Status: DC

## 2022-05-06 MED ORDER — METHOCARBAMOL 500 MG PO TABS
1000.0000 mg | ORAL_TABLET | Freq: Once | ORAL | Status: AC
Start: 2022-05-06 — End: 2022-05-06
  Administered 2022-05-06: 1000 mg via ORAL
  Filled 2022-05-06: qty 2

## 2022-05-06 MED ORDER — OXYCODONE-ACETAMINOPHEN 5-325 MG PO TABS
1.0000 | ORAL_TABLET | Freq: Once | ORAL | Status: AC
  Administered 2022-05-06: 1 via ORAL
  Filled 2022-05-06: qty 1

## 2022-05-06 NOTE — Discharge Instructions (Signed)
Follow-up with your primary care provider if any continued problems or concerns.  3 prescriptions were sent to your pharmacy.  Etodolac is twice a day for inflammation, methocarbamol 1 or 2 tablets every 6 hours as needed for muscle spasms and oxycodone every 6 hours if needed for pain.  Be aware that the oxycodone and methocarbamol could cause drowsiness and do not take while driving or operating machinery.  You may take the etodolac as it does not cause drowsiness if you plan on driving.  Ice or heat to your muscles as needed for discomfort.

## 2022-05-06 NOTE — ED Notes (Signed)
Dc ppr provided, followup and rx information reviewed. Pt provides verbal consent for dc. Pt off unit on foot

## 2022-05-06 NOTE — ED Provider Notes (Signed)
[facility] Provider Note    Event Date/Time   First MD Initiated Contact with Patient 05/06/22 1049     (approximate)   History   Back Pain   HPI  Kimberly Neal is a 36 y.o. female   presents to the ED with complaint of sudden onset of muscle pain to the right side of her back without injury.  Patient states that she got her children ready this morning and when she got to work was unable to move freely due to what feels like muscle spasms.  Patient was at work and has not taken any over-the-counter medication for this.  Negative medical history.      Physical Exam   Triage Vital Signs: ED Triage Vitals [05/06/22 1027]  Enc Vitals Group     BP (!) 110/43     Pulse Rate 71     Resp 16     Temp 98.2 F (36.8 C)     Temp Source Oral     SpO2 100 %     Weight 172 lb (78 kg)     Height 5\' 5"  (1.651 m)     Head Circumference      Peak Flow      Pain Score 9     Pain Loc      Pain Edu?      Excl. in GC?     Most recent vital signs: Vitals:   05/06/22 1027  BP: (!) 110/43  Pulse: 71  Resp: 16  Temp: 98.2 F (36.8 C)  SpO2: 100%     General: Awake, no distress.  CV:  Good peripheral perfusion.  Resp:  Normal effort.  Lungs are clear bilaterally. Abd:  No distention.  Other:  No tenderness is noted on palpation of the anterior chest wall or lateral aspect right ribs.  There is moderate tenderness and muscles are rigid thoracic paravertebral muscles with decreased range of motion secondary to increased pain.   ED Results / Procedures / Treatments   Labs (all labs ordered are listed, but only abnormal results are displayed) Labs Reviewed - No data to display    PROCEDURES:  Critical Care performed:   Procedures   MEDICATIONS ORDERED IN ED: Medications  methocarbamol (ROBAXIN) tablet 1,000 mg (1,000 mg Oral Given 05/06/22 1142)  oxyCODONE-acetaminophen (PERCOCET/ROXICET) 5-325 MG per tablet 1 tablet (1 tablet Oral Given  05/06/22 1142)  ketorolac (TORADOL) 30 MG/ML injection 30 mg (30 mg Intramuscular Given 05/06/22 1143)     IMPRESSION / MDM / ASSESSMENT AND PLAN / ED COURSE  I reviewed the triage vital signs and the nursing notes.   Differential diagnosis includes, but is not limited to, muscle strain, spontaneous muscle spasm, rib pain, back pain.  36 year old female presents to the ED with sudden onset of right posterior thoracic muscle spasms without history of injury.  Physical exam was positive for right paravertebral thoracic muscle tenderness with decreased range of motion.  Patient was given methocarbamol 1000 mg p.o., Percocet 5/325 and Toradol 30 mg IM injection.  Patient was then noted approximately an hour later that she had pain relief and also had improved range of motion and was able to get comfortable on the stretcher which she could not initially.  Patient was encouraged to use ice or heat to this area as needed and she was discharged with a prescription for methocarbamol, Percocet and etodolac to continue while at home.  She is return to the emergency department if any  worsening of her symptoms and also a note to remain out of work was given.      Patient's presentation is most consistent with acute, uncomplicated illness.  FINAL CLINICAL IMPRESSION(S) / ED DIAGNOSES   Final diagnoses:  Spasm of thoracic back muscle     Rx / DC Orders   ED Discharge Orders          Ordered    methocarbamol (ROBAXIN) 500 MG tablet        05/06/22 1244    oxyCODONE-acetaminophen (PERCOCET) 5-325 MG tablet  Every 6 hours PRN        05/06/22 1244    etodolac (LODINE) 400 MG tablet  2 times daily        05/06/22 1244             Note:  This document was prepared using Dragon voice recognition software and may include unintentional dictation errors.   Tommi Rumps, PA-C 05/06/22 1324    Merwyn Katos, MD 05/06/22 Mikle Bosworth

## 2022-05-06 NOTE — ED Triage Notes (Signed)
Pt here with back pain this morning. Pt states her pain is on the right side under her ribs. Pt states pain was sudden but she is ambulatory.

## 2022-05-07 DIAGNOSIS — S29012A Strain of muscle and tendon of back wall of thorax, initial encounter: Secondary | ICD-10-CM | POA: Insufficient documentation

## 2022-05-07 DIAGNOSIS — S29019A Strain of muscle and tendon of unspecified wall of thorax, initial encounter: Secondary | ICD-10-CM | POA: Insufficient documentation

## 2022-05-08 ENCOUNTER — Ambulatory Visit: Payer: Self-pay

## 2022-05-08 NOTE — Telephone Encounter (Signed)
Summary: pulled muscle   Pt went kayaking this weekend in the ocean and perhaps pulled a muscle.  She went to an ER Monday [facility] and was x-ray and did not have amy broken ribs.   She was given a muscle relaxer.  And pain medication.  Oxycodone,   She is still in quite a bit of pain and wants to know what should she do if anything else.   CB@  [phone]     Past 2 days ok- deep breath and y movement to right side-5/10   Chief Complaint: right side of chest hurts with movement and deep breath or cough Symptoms: pain Frequency: Tuesday Pertinent Negatives: Patient denies SOB, difficulty breathing Disposition: [] ED /[] Urgent Care (no appt availability in office) / [] Appointment(In office/virtual)/ []  [facility]/ [] Home Care/ [] Refused Recommended Disposition /[] [facility]/ [x]  Follow-up with PCP Additional Notes: appt schedule tomorrow  Reason for Disposition  Chest pain(s) lasting a few seconds from coughing  Answer Assessment - Initial Assessment Questions 1. LOCATION: "Where does it hurt?"       Right side  2. RADIATION: "Does the pain go anywhere else?" (e.g., into neck, jaw, arms, back)     no 3. ONSET: "When did the chest pain begin?" (Minutes, hours or days)  Tuesday 4. PATTERN: "Does the pain come and go, or has it been constant since it started?"  "Does it get worse with exertion?"      Comes and goes except with movement 5. DURATION: "How long does it last" (e.g., seconds, minutes, hours)     With movem 6. SEVERITY: "How bad is the pain?"  (e.g., Scale 1-10; mild, moderate, or severe)    - MILD (1-3): doesn't interfere with normal activities     - MODERATE (4-7): interferes with normal activities or awakens from sleep    - SEVERE (8-10): excruciating pain, unable to do any normal activities       5/10-7/10 7. CARDIAC RISK FACTORS: "Do you have any history of heart problems or risk factors for heart disease?" (e.g., angina, prior heart attack;  diabetes, high blood pressure, high cholesterol, smoker, or strong family history of heart disease)     no 8. PULMONARY RISK FACTORS: "Do you have any history of lung disease?"  (e.g., blood clots in lung, asthma, emphysema, birth control pills)     no 9. CAUSE: "What do you think is causing the chest pain?"     Pulled muscle 10. OTHER SYMPTOMS: "Do you have any other symptoms?" (e.g., dizziness, nausea, vomiting, sweating, fever, difficulty breathing, cough)       Hurts with deep breath,  11. PREGNANCY: "Is there any chance you are pregnant?" "When was your last menstrual period?"       N/a  Protocols used: Chest Pain-A-AH

## 2022-05-09 ENCOUNTER — Encounter: Payer: Self-pay | Admitting: Physician Assistant

## 2022-05-09 ENCOUNTER — Ambulatory Visit (INDEPENDENT_AMBULATORY_CARE_PROVIDER_SITE_OTHER): Payer: BC Managed Care – PPO | Admitting: Physician Assistant

## 2022-05-09 VITALS — BP 107/73 | HR 75 | Temp 98.8°F | Wt 172.3 lb

## 2022-05-09 DIAGNOSIS — M546 Pain in thoracic spine: Secondary | ICD-10-CM

## 2022-05-09 NOTE — Progress Notes (Unsigned)
Established Patient Office Visit  Name: Kimberly Neal   MRN: [id number]    DOB: [birth year]   Date:05/09/2022  Today's Provider: Jacquelin Hawking, MHS, PA-C Introduced myself to the patient as a PA-C and provided education on APPs in clinical practice.         Subjective  Chief Complaint  No chief complaint on file.   HPI   Patient Active Problem List   Diagnosis Date Noted   Low serum thyroid stimulating hormone (TSH) 02/05/2022   Palpitations 02/05/2022   Depression, recurrent (HCC) 11/06/2021   Anxiety 11/06/2021   Smoker    Dizziness 09/17/2020   Annual physical exam 09/17/2020   Hirsutism 09/17/2020   Esophageal spasm 08/31/2020   Acute bilateral low back pain without sciatica 06/28/2020   ASCUS with positive high risk HPV cervical 09/23/2017   Degenerative disc disease, lumbar 06/20/2015    Past Surgical History:  Procedure Laterality Date   TONSILLECTOMY AND ADENOIDECTOMY     TUBAL LIGATION  01/10/2018    Family History  Problem Relation Age of Onset   COPD Father    Leukemia Father    Hypertension Brother    Hyperlipidemia Brother    Asthma Son    Heart disease Maternal Grandmother    Diabetes Maternal Grandmother    Lung cancer Maternal Grandmother    Lung disease Maternal Grandfather    Kidney disease Paternal Grandmother    Diabetes Paternal Grandmother    Leukemia Paternal Grandfather     Social History   Tobacco Use   Smoking status: Every Day    Packs/day: 0.50    Types: Cigarettes   Smokeless tobacco: Never  Substance Use Topics   Alcohol use: Not Currently     Current Outpatient Medications:    busPIRone (BUSPAR) 5 MG tablet, Take 1 tablet (5 mg total) by mouth 2 (two) times daily as needed., Disp: 60 tablet, Rfl: 0   etodolac (LODINE) 400 MG tablet, Take 1 tablet (400 mg total) by mouth 2 (two) times daily., Disp: 20 tablet, Rfl: 0   methocarbamol (ROBAXIN) 500 MG tablet, 1 or 2 tablets every 6 hours as needed for muscle  spasms., Disp: 20 tablet, Rfl: 0   oxyCODONE-acetaminophen (PERCOCET) 5-325 MG tablet, Take 1 tablet by mouth every 6 (six) hours as needed for severe pain., Disp: 15 tablet, Rfl: 0  No Known Allergies  I personally reviewed {Reviewed:14835} with the patient/caregiver today.   ROS    Objective  There were no vitals filed for this visit.  There is no height or weight on file to calculate BMI.  Physical Exam   No results found for this or any previous visit (from the past 2160 hour(s)).   PHQ2/9:    02/27/2022    4:09 PM 02/13/2022    3:50 PM 02/05/2022    1:29 PM 01/03/2022    8:47 AM 11/28/2021    3:07 PM  Depression screen PHQ 2/9  Decreased Interest 1 2 1  0 0  Down, Depressed, Hopeless 2 2 1  0 1  PHQ - 2 Score 3 4 2  0 1  Altered sleeping 2 3 2 1 2   Tired, decreased energy 0 1 2 1 2   Change in appetite 0 1 2 0 0  Feeling bad or failure about yourself  1 2 2  0 1  Trouble concentrating 1 2 3 1 1   Moving slowly or fidgety/restless 0 0 3 0 0  Suicidal thoughts 0  0 0 0 0  PHQ-9 Score 7 13 16 3 7   Difficult doing work/chores Somewhat difficult Somewhat difficult Very difficult Not difficult at all Somewhat difficult      Fall Risk:    02/27/2022    4:09 PM 02/13/2022    3:50 PM 02/05/2022    1:29 PM 01/03/2022    8:47 AM 11/28/2021    3:07 PM  Fall Risk   Falls in the past year? 0 0 0 0 0  Number falls in past yr: 0 0 0 0 0  Injury with Fall? 0 0 0 0 0  Risk for fall due to : No Fall Risks No Fall Risks No Fall Risks No Fall Risks No Fall Risks  Follow up Falls evaluation completed Falls evaluation completed Falls evaluation completed Falls evaluation completed Falls evaluation completed      Functional Status Survey:      Assessment & Plan

## 2022-05-09 NOTE — Progress Notes (Unsigned)
Acute Office Visit   Patient: Kimberly Neal   DOB: 1986/03/20   36 y.o. Female  MRN: [id number] Visit Date: 05/09/2022  Today's healthcare provider: Oswaldo Conroy Kenlee Maler, PA-C  Introduced myself to the patient as a Secondary school teacher and provided education on APPs in clinical practice.    Chief Complaint  Patient presents with   Hospitalization Follow-up    Was put on 3 diff medications per patient, was seen by [facility].    Subjective    HPI HPI     Hospitalization Follow-up    Additional comments: Was put on 3 diff medications per patient, was seen by [facility].       Last edited by Junius Finner, Lysbeth Galas, CMA on 05/09/2022  4:03 PM.       She went to the beach and was kayaking in the ocean over the weekend She reports Tuesday she started having intense pain very suddenly  She is not aware of injuries or trauma over the weekend or during the week Located along right thoracic ribs and into back  Reports some limitations with right arm due to pain   Patient was seen for acute thoracic back spasm in ED on 05/06/22 and then by [facility] the next day  Was given Robaxin, Percocet and Etodolac from ED at discharge. Was given IM Toradol as well   Reports she is still very sore and deep breaths seem to exacerbate pain Reports discomfort with walking and laying down  She had xrays at [facility] and they did not find any bony abnormalities per patient She reports she is taking the Robaxin during the day  She is using the Etodolac once per day  She has been taking Percocets for extreme flares and at night due to somnolence She is using warm and cold compresses Using exercises from [facility] for stiffness     Medications: Outpatient Medications Prior to Visit  Medication Sig   etodolac (LODINE) 400 MG tablet Take 1 tablet (400 mg total) by mouth 2 (two) times daily.   methocarbamol (ROBAXIN) 500 MG tablet 1 or 2 tablets every 6 hours as needed for muscle spasms.    oxyCODONE-acetaminophen (PERCOCET) 5-325 MG tablet Take 1 tablet by mouth every 6 (six) hours as needed for severe pain.   busPIRone (BUSPAR) 5 MG tablet Take 1 tablet (5 mg total) by mouth 2 (two) times daily as needed. (Patient not taking: Reported on 05/09/2022)   No facility-administered medications prior to visit.    Review of Systems  Musculoskeletal:  Positive for back pain and myalgias. Negative for neck pain and neck stiffness.  Neurological:  Negative for weakness and numbness.    {Labs  Heme  Chem  Endocrine  Serology  Results Review (optional):23779}   Objective    BP 107/73   Pulse 75   Temp 98.8 F (37.1 C) (Oral)   Wt 172 lb 4.8 oz (78.2 kg)   LMP 05/06/2022   SpO2 99%   BMI 28.67 kg/m  {Show previous vital signs (optional):23777}  Physical Exam Vitals reviewed.  Constitutional:      General: She is awake.     Appearance: Normal appearance. She is well-developed, well-groomed and normal weight.  HENT:     Head: Normocephalic and atraumatic.  Pulmonary:     Effort: Pulmonary effort is normal.  Musculoskeletal:     Cervical back: Normal range of motion and neck supple. No swelling, edema, deformity, erythema, tenderness or  bony tenderness. Normal range of motion.     Thoracic back: Spasms present. Normal range of motion.     Lumbar back: No swelling, deformity, tenderness or bony tenderness. Normal range of motion.     Comments: ROM intact with lateral rotation, flexion and extension, lateral flexion Reports some pain with lateral rotation to the right and lateral flexion to the left Reports improved ROM with forward flexion- able to touch toes without issue.   Neurological:     Mental Status: She is alert.  Psychiatric:        Attention and Perception: Attention normal.        Mood and Affect: Mood normal.        Speech: Speech normal.        Behavior: Behavior normal. Behavior is cooperative.       No results found for any visits on  05/09/22.  Assessment & Plan      No follow-ups on file.       Problem List Items Addressed This Visit   None Visit Diagnoses     Acute right-sided thoracic back pain    -  Primary        No follow-ups on file.   I, Radhika Dershem E Tsutomu Barfoot, PA-C, have reviewed all documentation for this visit. The documentation on 05/09/22 for the exam, diagnosis, procedures, and orders are all accurate and complete.   Jacquelin Hawking, MHS, PA-C [facility]

## 2022-05-09 NOTE — Patient Instructions (Signed)
Based on your symptoms and physical exam I believe the following is the cause of your concern today Back pain likely secondary to a strain of your back muscles  I recommend the following at this time to help relieve that discomfort:   You can continue using the Etodolac, Robaxin and Percocet as you are already  Then you can transition to Tylenol and Ibuprofen once the Etodolac and Percocet are finished  Rest Warm compresses to the area (20 minutes on, minimum of 30 minutes off) You can alternate Tylenol and Ibuprofen for pain management but Ibuprofen is typically preferred to reduce inflammation.  Gentle stretches and exercises that I have included in your paperwork You can try to get a TENS unit and use this as well. No more than 20 minutes at a time   Try to reduce excess strain to the area and rest as much as possible  Wear supportive shoes and, if you must lift anything, use proper lifting techniques that spare your back.   If these measures do not lead to improvement in your symptoms over the next 2-4 weeks please let us know

## 2022-06-23 ENCOUNTER — Encounter: Payer: Self-pay | Admitting: Nurse Practitioner

## 2022-06-23 ENCOUNTER — Other Ambulatory Visit: Payer: Self-pay | Admitting: Nurse Practitioner

## 2022-06-24 NOTE — Telephone Encounter (Signed)
Requested Prescriptions  Pending Prescriptions Disp Refills  . busPIRone (BUSPAR) 5 MG tablet [Pharmacy Med Name: BUSPIRONE HCL 5 MG TABLET] 60 tablet 0    Sig: TAKE 1 TABLET BY MOUTH 2 TIMES DAILY AS NEEDED.     Psychiatry: Anxiolytics/Hypnotics - Non-controlled Passed - 06/23/2022 12:36 PM      Passed - Valid encounter within last 12 months    Recent Outpatient Visits          1 month ago Acute right-sided thoracic back pain   [facility] Mecum, Erin E, PA-C   3 months ago Depression, recurrent ([place])   [place], Karen, NP   4 months ago Low TSH level   [facility], Santiago Glad, NP   4 months ago Low serum thyroid stimulating hormone (TSH)   [facility] Vigg, Avanti, MD   5 months ago Annual physical exam   [facility] Jon Billings, NP      Future Appointments            Tomorrow Venita Lick, NP [facility]

## 2022-06-25 ENCOUNTER — Ambulatory Visit (INDEPENDENT_AMBULATORY_CARE_PROVIDER_SITE_OTHER): Payer: BC Managed Care – PPO | Admitting: Nurse Practitioner

## 2022-06-25 ENCOUNTER — Encounter: Payer: Self-pay | Admitting: Nurse Practitioner

## 2022-06-25 VITALS — BP 98/66 | HR 73 | Temp 98.3°F | Ht 65.0 in | Wt 172.8 lb

## 2022-06-25 DIAGNOSIS — F339 Major depressive disorder, recurrent, unspecified: Secondary | ICD-10-CM | POA: Diagnosis not present

## 2022-06-25 DIAGNOSIS — R7989 Other specified abnormal findings of blood chemistry: Secondary | ICD-10-CM | POA: Diagnosis not present

## 2022-06-25 DIAGNOSIS — F419 Anxiety disorder, unspecified: Secondary | ICD-10-CM

## 2022-06-25 MED ORDER — HYDROXYZINE PAMOATE 25 MG PO CAPS: 25.0000 mg | ORAL_CAPSULE | Freq: Three times a day (TID) | ORAL | 5 refills | Status: DC | PRN

## 2022-06-25 MED ORDER — BUSPIRONE HCL 10 MG PO TABS: 10.0000 mg | ORAL_TABLET | Freq: Two times a day (BID) | ORAL | 12 refills | Status: DC

## 2022-06-25 NOTE — Assessment & Plan Note (Signed)
Refer to anxiety plan of care. 

## 2022-06-25 NOTE — Patient Instructions (Signed)

## 2022-06-25 NOTE — Progress Notes (Signed)
BP 98/66   Pulse 73   Temp 98.3 F (36.8 C) (Oral)   Ht 5\' 5"  (1.651 m)   Wt 172 lb 12.8 oz (78.4 kg)   SpO2 99%   BMI 28.76 kg/m    Subjective:    Patient ID: , female    DOB: [birth year], 36 y.o.   MRN: [id number]  HPI: Kimberly Neal is a 36 y.o. female  Chief Complaint  Patient presents with   Thyroid Check    Patient is here for Thyroid Check. Patient says her anxiety and panic attacks have increased here recently and she says she mentioned possibly increase in Buspar prescription and said when she reached out to her provider, she was informed to come and have her Thyroid levels rechecked.    LOW TSH Noted on past labs to be on lower side.  She is followed by [facility] endocrinology with last visit 04/14/22 = TSH July 2023 was 0.821. Fatigue: yes Cold intolerance: yes Heat intolerance: yes Weight gain: no Weight loss: no Constipation: no Diarrhea/loose stools: no Palpitations: no  Lower extremity edema: no Anxiety/depressed mood: yes   ANXIETY Currently taking Buspar 5 MG BID -- taking this consistently BID for two weeks. Is having some increased stressors -- have 6 kids, working two jobs, and August 2023.  Mood status: uncontrolled Satisfied with current treatment?: yes Symptom severity: mild  Duration of current treatment : months Side effects: no Medication compliance: good compliance Psychotherapy/counseling: none Previous psychiatric medications: Lexapro (did not work for her, caused night sweats), Vistaril (makes too sleepy) Depressed mood: yes Anxious mood: yes Anhedonia: no Significant weight loss or gain: no Insomnia: yes hard to fall asleep Fatigue: yes Feelings of worthlessness or guilt:  some guilt Impaired concentration/indecisiveness: no Suicidal ideations: no Hopelessness: no Crying spells: no    06/25/2022    1:24 PM 02/27/2022    4:09 PM 02/13/2022    3:50 PM 02/05/2022    1:29 PM 01/03/2022    8:47 AM   Depression screen PHQ 2/9  Decreased Interest 2 1 2 1  0  Down, Depressed, Hopeless 2 2 2 1  0  PHQ - 2 Score 4 3 4 2  0  Altered sleeping 3 2 3 2 1   Tired, decreased energy 2 0 1 2 1   Change in appetite 0 0 1 2 0  Feeling bad or failure about yourself  1 1 2 2  0  Trouble concentrating 1 1 2 3 1   Moving slowly or fidgety/restless 0 0 0 3 0  Suicidal thoughts 0 0 0 0 0  PHQ-9 Score 11 7 13 16 3   Difficult doing work/chores Somewhat difficult Somewhat difficult Somewhat difficult Very difficult Not difficult at all       06/25/2022    1:25 PM 02/27/2022    4:11 PM 02/13/2022    3:50 PM 02/05/2022    1:29 PM  GAD 7 : Generalized Anxiety Score  Nervous, Anxious, on Edge 3 3 3 3   Control/stop worrying 3 1 3 3   Worry too much - different things 3 1 3 3   Trouble relaxing 3 3 3 3   Restless 3 2 3 3   Easily annoyed or irritable 3 1 3 3   Afraid - awful might happen 2 1 2 3   Total GAD 7 Score 20 12 20 21   Anxiety Difficulty Somewhat difficult Somewhat difficult Somewhat difficult Very difficult    Relevant past medical, surgical, family and social history reviewed and updated as  indicated. Interim medical history since our last visit reviewed. Allergies and medications reviewed and updated.  Review of Systems  Constitutional:  Negative for activity change, appetite change, diaphoresis, fatigue and fever.  Respiratory:  Negative for cough, chest tightness and shortness of breath.   Cardiovascular:  Negative for chest pain, palpitations and leg swelling.  Neurological: Negative.   Psychiatric/Behavioral:  Positive for decreased concentration and sleep disturbance. Negative for self-injury and suicidal ideas. The patient is nervous/anxious.     Per HPI unless specifically indicated above     Objective:    BP 98/66   Pulse 73   Temp 98.3 F (36.8 C) (Oral)   Ht 5\' 5"  (1.651 m)   Wt 172 lb 12.8 oz (78.4 kg)   SpO2 99%   BMI 28.76 kg/m   Wt Readings from Last 3 Encounters:  06/25/22  172 lb 12.8 oz (78.4 kg)  05/09/22 172 lb 4.8 oz (78.2 kg)  05/06/22 172 lb (78 kg)    Physical Exam Vitals and nursing note reviewed.  Constitutional:      General: She is awake. She is not in acute distress.    Appearance: She is well-developed and well-groomed. She is not ill-appearing or toxic-appearing.  HENT:     Head: Normocephalic.     Right Ear: Hearing normal.     Left Ear: Hearing normal.  Eyes:     General: Lids are normal.        Right eye: No discharge.        Left eye: No discharge.     Conjunctiva/sclera: Conjunctivae normal.     Pupils: Pupils are equal, round, and reactive to light.  Neck:     Thyroid: No thyromegaly.     Vascular: No carotid bruit.  Cardiovascular:     Rate and Rhythm: Normal rate and regular rhythm.     Heart sounds: Normal heart sounds. No murmur heard.    No gallop.  Pulmonary:     Effort: Pulmonary effort is normal. No accessory muscle usage or respiratory distress.     Breath sounds: Normal breath sounds.  Abdominal:     General: Bowel sounds are normal.     Palpations: Abdomen is soft.  Musculoskeletal:     Cervical back: Normal range of motion and neck supple.     Right lower leg: No edema.     Left lower leg: No edema.  Lymphadenopathy:     Cervical: No cervical adenopathy.  Skin:    General: Skin is warm and dry.  Neurological:     Mental Status: She is alert and oriented to person, place, and time.  Psychiatric:        Attention and Perception: Attention normal.        Mood and Affect: Mood normal.        Speech: Speech normal.        Behavior: Behavior normal. Behavior is cooperative.        Thought Content: Thought content normal.        Judgment: Judgment normal.     Results for orders placed or performed in visit on 02/05/22  TSH  Result Value Ref Range   TSH 0.367 (L) 0.450 - 4.500 uIU/mL  T4, free  Result Value Ref Range   Free T4 1.27 0.82 - 1.77 ng/dL      Assessment & Plan:   Problem List Items  Addressed This Visit       Other   Anxiety  Chronic, exacerbated by life stressors at present.  Denies SI/HI.  Will increased her Buspar to 10 MG BID and refill Vistaril.  Discussed with her if ongoing anxiety may benefit trial of alternate SSRI like Celexa added with Buspar for further anxiety control and benefit of anticholinergic affect of Celexa.  Return to office in 6 weeks for follow-up virtually.      Relevant Medications   hydrOXYzine (VISTARIL) 25 MG capsule   busPIRone (BUSPAR) 10 MG tablet   Depression, recurrent (HCC) - Primary    Refer to anxiety plan of care.      Relevant Medications   hydrOXYzine (VISTARIL) 25 MG capsule   busPIRone (BUSPAR) 10 MG tablet   Low serum thyroid stimulating hormone (TSH)    Ongoing, followed by endocrinology at [facility] -- recent notes and labs reviewed.  Due to increased anxiety will recheck labs today, although suspect more related to her increased stressors.      Relevant Orders   T4, free   TSH     Follow up plan: Return in about 6 weeks (around 08/06/2022) for Anxiety follow-up with Clydie Braun virtual.

## 2022-06-25 NOTE — Assessment & Plan Note (Signed)
Chronic, exacerbated by life stressors at present.  Denies SI/HI.  Will increased her Buspar to 10 MG BID and refill Vistaril.  Discussed with her if ongoing anxiety may benefit trial of alternate SSRI like Celexa added with Buspar for further anxiety control and benefit of anticholinergic affect of Celexa.  Return to office in 6 weeks for follow-up virtually.

## 2022-06-25 NOTE — Assessment & Plan Note (Signed)
Ongoing, followed by endocrinology at [facility] -- recent notes and labs reviewed.  Due to increased anxiety will recheck labs today, although suspect more related to her increased stressors.

## 2022-06-26 LAB — T4, FREE: Free T4: 1.12 ng/dL (ref 0.82–1.77)

## 2022-06-26 LAB — TSH: TSH: 0.612 u[IU]/mL (ref 0.450–4.500)

## 2022-06-26 NOTE — Progress Notes (Signed)
Contacted via MyChart   Good morning, your labs have returned and thyroid labs are normal.  Great news!!

## 2022-07-23 ENCOUNTER — Other Ambulatory Visit: Payer: Self-pay | Admitting: Nurse Practitioner

## 2022-07-24 NOTE — Telephone Encounter (Signed)
Requested medications are due for refill today.  no  Requested medications are on the active medications list.  yes  Last refill. 06/25/2022 #60 12 rf  Future visit scheduled.   yes  Notes to clinic.  Pharmacy comment: REQUEST FOR 90 DAYS PRESCRIPTION.    Requested Prescriptions  Pending Prescriptions Disp Refills   busPIRone (BUSPAR) 10 MG tablet [Pharmacy Med Name: BUSPIRONE HCL 10 MG TABLET] 180 tablet 5    Sig: TAKE 1 TABLET BY MOUTH TWICE A DAY     Psychiatry: Anxiolytics/Hypnotics - Non-controlled Passed - 07/23/2022  2:33 PM      Passed - Valid encounter within last 12 months    Recent Outpatient Visits           4 weeks ago Depression, recurrent ([place])   [place], Jolene T, NP   2 months ago Acute right-sided thoracic back pain   [employer], Erin E, PA-C   4 months ago Depression, recurrent ([place])   [place], Karen, NP   5 months ago Low TSH level   [place], Santiago Glad, NP   5 months ago Low serum thyroid stimulating hormone (TSH)   [facility] V[place], Avanti, MD       Future Appointments             In 1 week Jon Billings, NP [facility] [place]

## 2022-08-06 ENCOUNTER — Telehealth: Payer: BC Managed Care – PPO | Admitting: Nurse Practitioner

## 2022-08-06 NOTE — Progress Notes (Deleted)
There were no vitals taken for this visit.   Subjective:    Patient ID: Kimberly Neal, female    DOB: 1986/06/27, 36 y.o.   MRN: [id number]  HPI: Kimberly Neal is a 36 y.o. female  No chief complaint on file.  LOW TSH Noted on past labs to be on lower side.  She is followed by [facility] endocrinology with last visit 04/14/22 = TSH July 2023 was 0.821. Fatigue: yes Cold intolerance: yes Heat intolerance: yes Weight gain: no Weight loss: no Constipation: no Diarrhea/loose stools: no Palpitations: no  Lower extremity edema: no Anxiety/depressed mood: yes   ANXIETY Currently taking Buspar 5 MG BID -- taking this consistently BID for two weeks. 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  Mood status: uncontrolled Satisfied with current treatment?: yes Symptom severity: mild  Duration of current treatment : months Side effects: no Medication compliance: good compliance Psychotherapy/counseling: none Previous psychiatric medications: Lexapro (did not work for her, caused night sweats), Vistaril (makes too sleepy) Depressed mood: yes Anxious mood: yes Anhedonia: no Significant weight loss or gain: no Insomnia: yes hard to fall asleep Fatigue: yes Feelings of worthlessness or guilt:  some guilt Impaired concentration/indecisiveness: no Suicidal ideations: no Hopelessness: no Crying spells: no    06/25/2022    1:24 PM 02/27/2022    4:09 PM 02/13/2022    3:50 PM 02/05/2022    1:29 PM 01/03/2022    8:47 AM  Depression screen PHQ 2/9  Decreased Interest 2 1 2 1  0  Down, Depressed, Hopeless 2 2 2 1  0  PHQ - 2 Score 4 3 4 2  0  Altered sleeping 3 2 3 2 1   Tired, decreased energy 2 0 1 2 1   Change in appetite 0 0 1 2 0  Feeling bad or failure about yourself  1 1 2 2  0  Trouble concentrating 1 1 2 3 1   Moving slowly or fidgety/restless 0 0 0 3 0  Suicidal thoughts 0 0 0 0 0  PHQ-9 Score 11 7 13 16 3   Difficult  doing work/chores Somewhat difficult Somewhat difficult Somewhat difficult Very difficult Not difficult at all       06/25/2022    1:25 PM 02/27/2022    4:11 PM 02/13/2022    3:50 PM 02/05/2022    1:29 PM  GAD 7 : Generalized Anxiety Score  Nervous, Anxious, on Edge 3 3 3 3   Control/stop worrying 3 1 3 3   Worry too much - different things 3 1 3 3   Trouble relaxing 3 3 3 3   Restless 3 2 3 3   Easily annoyed or irritable 3 1 3 3   Afraid - awful might happen 2 1 2 3   Total GAD 7 Score 20 12 20 21   Anxiety Difficulty Somewhat difficult Somewhat difficult Somewhat difficult Very difficult    Relevant past medical, surgical, family and social history reviewed and updated as indicated. Interim medical history since our last visit reviewed. Allergies and medications reviewed and updated.  Review of Systems  Constitutional:  Negative for activity change, appetite change, diaphoresis, fatigue and fever.  Respiratory:  Negative for cough, chest tightness and shortness of breath.   Cardiovascular:  Negative for chest pain, palpitations and leg swelling.  Neurological: Negative.   Psychiatric/Behavioral:  Positive for decreased concentration and sleep disturbance. Negative for self-injury and suicidal ideas. The patient is nervous/anxious.     Per HPI unless specifically indicated  above     Objective:    There were no vitals taken for this visit.  Wt Readings from Last 3 Encounters:  06/25/22 172 lb 12.8 oz (78.4 kg)  05/09/22 172 lb 4.8 oz (78.2 kg)  05/06/22 172 lb (78 kg)    Physical Exam Vitals and nursing note reviewed.  Constitutional:      General: She is awake. She is not in acute distress.    Appearance: She is well-developed and well-groomed. She is not ill-appearing or toxic-appearing.  HENT:     Head: Normocephalic.     Right Ear: Hearing normal.     Left Ear: Hearing normal.  Eyes:     General: Lids are normal.        Right eye: No discharge.        Left eye: No  discharge.     Conjunctiva/sclera: Conjunctivae normal.     Pupils: Pupils are equal, round, and reactive to light.  Neck:     Thyroid: No thyromegaly.     Vascular: No carotid bruit.  Cardiovascular:     Rate and Rhythm: Normal rate and regular rhythm.     Heart sounds: Normal heart sounds. No murmur heard.    No gallop.  Pulmonary:     Effort: Pulmonary effort is normal. No accessory muscle usage or respiratory distress.     Breath sounds: Normal breath sounds.  Abdominal:     General: Bowel sounds are normal.     Palpations: Abdomen is soft.  Musculoskeletal:     Cervical back: Normal range of motion and neck supple.     Right lower leg: No edema.     Left lower leg: No edema.  Lymphadenopathy:     Cervical: No cervical adenopathy.  Skin:    General: Skin is warm and dry.  Neurological:     Mental Status: She is alert and oriented to person, place, and time.  Psychiatric:        Attention and Perception: Attention normal.        Mood and Affect: Mood normal.        Speech: Speech normal.        Behavior: Behavior normal. Behavior is cooperative.        Thought Content: Thought content normal.        Judgment: Judgment normal.    Results for orders placed or performed in visit on 06/25/22  T4, free  Result Value Ref Range   Free T4 1.12 0.82 - 1.77 ng/dL  TSH  Result Value Ref Range   TSH 0.612 0.450 - 4.500 uIU/mL      Assessment & Plan:   Problem List Items Addressed This Visit   None    Follow up plan: No follow-ups on file.

## 2022-10-10 ENCOUNTER — Ambulatory Visit (INDEPENDENT_AMBULATORY_CARE_PROVIDER_SITE_OTHER): Payer: BC Managed Care – PPO | Admitting: Podiatry

## 2022-10-10 DIAGNOSIS — M2011 Hallux valgus (acquired), right foot: Secondary | ICD-10-CM

## 2022-10-10 DIAGNOSIS — Q666 Other congenital valgus deformities of feet: Secondary | ICD-10-CM | POA: Diagnosis not present

## 2022-10-10 NOTE — Progress Notes (Signed)
Subjective:  Patient ID: Kimberly Neal, female    DOB: 1986/01/22,  MRN: [id number]  Chief Complaint  Patient presents with   Bunions    Bilateral bunions pt wanted to come in and have them evaluated and see what her options are    37 y.o. female presents with the above complaint.  Patient presents with bilateral bunion deformity right greater than left side.  Patient states that the right 1 is more painful has been on for quite some time she has tried shoe gear modification but has not helped.  She would like to discuss surgical options and discuss treatment plans.  She does not wear any orthotics she wears regular shoes.  Review of Systems: Negative except as noted in the HPI. Denies N/V/F/Ch.  Past Medical History:  Diagnosis Date   DDD (degenerative disc disease), lumbar    HPV in female    Smoker    UTI (urinary tract infection)     Current Outpatient Medications:    doxycycline (VIBRAMYCIN) 100 MG capsule, Take 100 mg by mouth 2 (two) times daily., Disp: , Rfl:    busPIRone (BUSPAR) 10 MG tablet, TAKE 1 TABLET BY MOUTH TWICE A DAY, Disp: 180 tablet, Rfl: 5   hydrOXYzine (VISTARIL) 25 MG capsule, Take 1 capsule (25 mg total) by mouth every 8 (eight) hours as needed., Disp: 30 capsule, Rfl: 5  Social History   Tobacco Use  Smoking Status Every Day   Packs/day: 0.50   Types: Cigarettes  Smokeless Tobacco Never    No Known Allergies Objective:  There were no vitals filed for this visit. There is no height or weight on file to calculate BMI. Constitutional Well developed. Well nourished.  Vascular Dorsalis pedis pulses palpable bilaterally. Posterior tibial pulses palpable bilaterally. Capillary refill normal to all digits.  No cyanosis or clubbing noted. Pedal hair growth normal.  Neurologic Normal speech. Oriented to person, place, and time. Epicritic sensation to light touch grossly present bilaterally.  Dermatologic Nails well groomed and normal in  appearance. No open wounds. No skin lesions.  Orthopedic: Normal joint ROM without pain or crepitus bilaterally. Hallux abductovalgus deformity present this is a reducible tracking deformity.  No intra-articular first MPJ pain noted Left 1st MPJ diminished range of motion. Left 1st TMT without gross hypermobility. Right 1st MPJ diminished range of motion  Right 1st TMT without gross hypermobility. Lesser digital contractures absent bilaterally.  Semiflexible pes planovalgus foot structure noted with calcaneovalgus to many toe signs partially removed.  The arch with dorsiflexion of the hallux able to perform single and double heel raise with return of calcaneus to neutral position.   Radiographs: None  Assessment:   1. Pes planovalgus   2. Hav (hallux abducto valgus), right    Plan:  Patient was evaluated and treated and all questions answered.  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  Discussed with patient she states in the standing she will get back to me when she is ready  Pes planovalgus -I explained to patient the etiology of pes planovalgus and relationship with bunions and various treatment options were discussed.  Given patient foot structure in the setting of bunions I believe patient will benefit from custom-made orthotics to help control  the hindfoot motion support the arch of the foot and take the stress away from arch. patient agrees with the plan like to proceed with orthotics -Patient was casted for orthotics  No follow-ups on file.

## 2022-11-14 ENCOUNTER — Encounter: Payer: Self-pay | Admitting: Podiatry

## 2022-11-14 ENCOUNTER — Ambulatory Visit: Payer: BC Managed Care – PPO | Admitting: Podiatry

## 2022-11-14 DIAGNOSIS — Z01818 Encounter for other preprocedural examination: Secondary | ICD-10-CM | POA: Diagnosis not present

## 2022-11-14 DIAGNOSIS — M2011 Hallux valgus (acquired), right foot: Secondary | ICD-10-CM | POA: Diagnosis not present

## 2022-11-14 DIAGNOSIS — Q666 Other congenital valgus deformities of feet: Secondary | ICD-10-CM

## 2022-11-14 MED ORDER — ALPRAZOLAM 0.5 MG PO TABS: 0.5000 mg | ORAL_TABLET | Freq: Two times a day (BID) | ORAL | 0 refills | Status: DC | PRN

## 2022-11-14 NOTE — Progress Notes (Signed)
Subjective:  Patient ID: Kimberly Neal, female    DOB: 07-09-1986,  MRN: [id number]  Chief Complaint  Patient presents with   Bunions    "He told me to come in when I was ready to schedule surgery on my Bunion."    37 y.o. female presents with the above complaint.  Patient presents for follow-up of bilateral bunion deformity right more painful than left side.  She states it still hurts has not better she wanted to discuss surgical options at this time she is try conservative care has not helped.  Review of Systems: Negative except as noted in the HPI. Denies N/V/F/Ch.  Past Medical History:  Diagnosis Date   DDD (degenerative disc disease), lumbar    HPV in female    Smoker    UTI (urinary tract infection)     Current Outpatient Medications:    ALPRAZolam (XANAX) 0.5 MG tablet, Take 1 tablet (0.5 mg total) by mouth 2 (two) times daily as needed for anxiety., Disp: 20 tablet, Rfl: 0   busPIRone (BUSPAR) 10 MG tablet, TAKE 1 TABLET BY MOUTH TWICE A DAY (Patient taking differently: Take 10 mg by mouth as needed.), Disp: 180 tablet, Rfl: 5   hydrOXYzine (VISTARIL) 25 MG capsule, Take 1 capsule (25 mg total) by mouth every 8 (eight) hours as needed., Disp: 30 capsule, Rfl: 5   doxycycline (VIBRAMYCIN) 100 MG capsule, Take 100 mg by mouth 2 (two) times daily., Disp: , Rfl:   Social History   Tobacco Use  Smoking Status Every Day   Packs/day: 0.50   Types: Cigarettes  Smokeless Tobacco Never    No Known Allergies Objective:  There were no vitals filed for this visit. There is no height or weight on file to calculate BMI. Constitutional Well developed. Well nourished.  Vascular Dorsalis pedis pulses palpable bilaterally. Posterior tibial pulses palpable bilaterally. Capillary refill normal to all digits.  No cyanosis or clubbing noted. Pedal hair growth normal.  Neurologic Normal speech. Oriented to person, place, and time. Epicritic sensation to light touch grossly  present bilaterally.  Dermatologic Nails well groomed and normal in appearance. No open wounds. No skin lesions.  Orthopedic: Normal joint ROM without pain or crepitus bilaterally. Hallux abductovalgus deformity present this is a reducible tracking deformity.  No intra-articular first MPJ pain noted Left 1st MPJ diminished range of motion. Left 1st TMT without gross hypermobility. Right 1st MPJ diminished range of motion  Right 1st TMT without gross hypermobility. Lesser digital contractures absent bilaterally.  Semiflexible pes planovalgus foot structure noted with calcaneovalgus to many toe signs partially removed.  The arch with dorsiflexion of the hallux able to perform single and double heel raise with return of calcaneus to neutral position.   Radiographs: None  Assessment:   No diagnosis found.  Plan:  Patient was evaluated and treated and all questions answered.  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 -Informed surgical risk consent was reviewed and read aloud to the patient.  I reviewed the films.  I have discussed my findings with the patient in great detail.  I have discussed all risks including but not  limited to infection, stiffness, scarring, limp, disability, deformity, damage to blood vessels and nerves, numbness, poor healing, need for braces, arthritis, chronic pain, amputation, death.  All benefits and realistic expectations discussed in great detail.  I have made no promises as to the outcome.  I  have provided realistic expectations.  I have offered the patient a 2nd opinion, which they have declined and assured me they preferred to proceed despite the risks   Pes planovalgus -I explained to patient the etiology of pes planovalgus and relationship with bunions and various treatment options were discussed.  Given patient foot structure in the setting of bunions I believe patient will benefit from custom-made orthotics to help control the hindfoot motion support the arch of the foot and take the stress away from arch. patient agrees with the plan like to proceed with orthotics -Patient was casted for orthotics  No follow-ups on file.

## 2022-11-24 ENCOUNTER — Telehealth: Payer: Self-pay | Admitting: Urology

## 2022-11-24 NOTE — Telephone Encounter (Signed)
DOS - 12/15/22   [place] BUNIONECTOMY RIGHT  --- [phone] DOUBLE OSTEOTOMY RIGHT --- [phone]  [employer]   SPOKE WITH SHONTAE WITH [employer] AND SHE STATED THAT FOR CPT CODES 36644 AND 03474 NO PRIOR AUTH IS REQUIRED.  REF # [id number] S. 11/24/22 AT 8:39 AM EST   PER [employer]'S [employer] FOR CPT CODES 25956 AND 38756 NO PRIOR AUTH IS REQUIRED.  REF # [id number]

## 2022-12-15 ENCOUNTER — Other Ambulatory Visit: Payer: Self-pay | Admitting: Podiatry

## 2022-12-15 ENCOUNTER — Encounter: Payer: Self-pay | Admitting: Podiatry

## 2022-12-15 DIAGNOSIS — M2011 Hallux valgus (acquired), right foot: Secondary | ICD-10-CM | POA: Diagnosis not present

## 2022-12-15 HISTORY — PX: BUNIONECTOMY: SHX129

## 2022-12-15 MED ORDER — IBUPROFEN 800 MG PO TABS: 800.0000 mg | ORAL_TABLET | Freq: Four times a day (QID) | ORAL | 1 refills | Status: DC | PRN

## 2022-12-15 MED ORDER — OXYCODONE-ACETAMINOPHEN 5-325 MG PO TABS: 1.0000 | ORAL_TABLET | ORAL | 0 refills | Status: DC | PRN

## 2022-12-18 ENCOUNTER — Telehealth: Payer: Self-pay | Admitting: *Deleted

## 2022-12-18 NOTE — Telephone Encounter (Signed)
Patient is calling because the front of her chin, possibly top of foot is still numb,after having surgery on Mon.,is this normal?

## 2022-12-18 NOTE — Telephone Encounter (Signed)
Called patient to update, verbalized understanding.

## 2022-12-23 ENCOUNTER — Ambulatory Visit (INDEPENDENT_AMBULATORY_CARE_PROVIDER_SITE_OTHER): Payer: BC Managed Care – PPO

## 2022-12-23 ENCOUNTER — Encounter: Payer: Self-pay | Admitting: Podiatry

## 2022-12-23 ENCOUNTER — Ambulatory Visit (INDEPENDENT_AMBULATORY_CARE_PROVIDER_SITE_OTHER): Payer: BC Managed Care – PPO | Admitting: Podiatry

## 2022-12-23 DIAGNOSIS — Z9889 Other specified postprocedural states: Secondary | ICD-10-CM

## 2022-12-23 DIAGNOSIS — M2011 Hallux valgus (acquired), right foot: Secondary | ICD-10-CM

## 2022-12-23 MED ORDER — OXYCODONE-ACETAMINOPHEN 5-325 MG PO TABS: 1.0000 | ORAL_TABLET | ORAL | 0 refills | Status: DC | PRN

## 2022-12-23 NOTE — Progress Notes (Signed)
  Subjective:  Patient ID: Kimberly Neal, female    DOB: Nov 09, 1985,  MRN: [id number]  Chief Complaint  Patient presents with   Routine Post Op    "It feels numb."    DOS: 12/15/2022 Procedure: Right bunionectomy with phalangeal osteotomy  37 y.o. female returns for post-op check..  Patient states that she is doing well.  Denies any other acute complaints.  Some pain with ambulation.  She is ambulating with cam boot.  No strikethrough noted no nausea fever chills vomiting  Review of Systems: Negative except as noted in the HPI. Denies N/V/F/Ch.  Past Medical History:  Diagnosis Date   DDD (degenerative disc disease), lumbar    HPV in female    Smoker    UTI (urinary tract infection)     Current Outpatient Medications:    ALPRAZolam (XANAX) 0.5 MG tablet, Take 1 tablet (0.5 mg total) by mouth 2 (two) times daily as needed for anxiety., Disp: 20 tablet, Rfl: 0   busPIRone (BUSPAR) 10 MG tablet, TAKE 1 TABLET BY MOUTH TWICE A DAY (Patient taking differently: Take 10 mg by mouth as needed.), Disp: 180 tablet, Rfl: 5   hydrOXYzine (VISTARIL) 25 MG capsule, Take 1 capsule (25 mg total) by mouth every 8 (eight) hours as needed., Disp: 30 capsule, Rfl: 5   ibuprofen (ADVIL) 800 MG tablet, Take 1 tablet (800 mg total) by mouth every 6 (six) hours as needed., Disp: 60 tablet, Rfl: 1   oxyCODONE-acetaminophen (PERCOCET) 5-325 MG tablet, Take 1 tablet by mouth every 4 (four) hours as needed for severe pain., Disp: 30 tablet, Rfl: 0   oxyCODONE-acetaminophen (PERCOCET) 5-325 MG tablet, Take 1 tablet by mouth every 4 (four) hours as needed for severe pain., Disp: 30 tablet, Rfl: 0   doxycycline (VIBRAMYCIN) 100 MG capsule, Take 100 mg by mouth 2 (two) times daily., Disp: , Rfl:   Social History   Tobacco Use  Smoking Status Every Day   Packs/day: .5   Types: Cigarettes  Smokeless Tobacco Never    No Known Allergies Objective:  There were no vitals filed for this visit. There is no  height or weight on file to calculate BMI. Constitutional Well developed. Well nourished.  Vascular Foot warm and well perfused. Capillary refill normal to all digits.   Neurologic Normal speech. Oriented to person, place, and time. Epicritic sensation to light touch grossly present bilaterally.  Dermatologic Skin healing well without signs of infection. Skin edges well coapted without signs of infection.  Orthopedic: Tenderness to palpation noted about the surgical site.   Radiographs: 3 views of skeletally mature adult right foot: Good correction and reduction of bunion deformity noted.  Sesamoid position reduced.  No other abnormalities identified Assessment:   1. Status post surgery    Plan:  Patient was evaluated and treated and all questions answered.  S/p foot surgery right -Progressing as expected post-operatively. -XR: See above -WB Status: Weightbearing as tolerated in cam boot -Sutures: Intact.  No clinical signs of Deis is noted no complication noted. -Medications: None -Foot redressed.  No follow-ups on file.

## 2022-12-29 ENCOUNTER — Telehealth: Payer: Self-pay

## 2022-12-29 NOTE — Transitions of Care (Post Inpatient/ED Visit) (Signed)
   12/29/2022  Name: Kimberly Neal MRN: [id number] DOB: [birth year]  Today's TOC FU Call Status: Today's TOC FU Call Status:: Successful TOC FU Call Competed TOC FU Call Complete Date: 12/29/22  Transition Care Management Follow-up Telephone Call Date of Discharge: 12/28/22 Discharge Facility: Other ([place]) Name of Other (Non-[facility]) Discharge Facility: [facility] Type of Discharge: Emergency Department Reason for ED Visit: Renal How have you been since you were released from the hospital?: Same Any questions or concerns?: No  Items Reviewed: Did you receive and understand the discharge instructions provided?: Yes Medications obtained and verified?: Yes (Medications Reviewed) Any new allergies since your discharge?: No Dietary orders reviewed?: NA Do you have support at home?: Yes People in Home: spouse  Home Care and Equipment/Supplies: [facility] Ordered?: NA Any new equipment or medical supplies ordered?: NA  Functional Questionnaire: Do you need assistance with bathing/showering or dressing?: No Do you need assistance with meal preparation?: No Do you need assistance with eating?: No Do you have difficulty maintaining continence: No Do you need assistance with getting out of bed/getting out of a chair/moving?: No Do you have difficulty managing or taking your medications?: No  Follow up appointments reviewed: PCP Follow-up appointment confirmed?: No MD Provider Line Number:[phone] Given: No Specialist Hospital Follow-up appointment confirmed?: NA Do you need transportation to your follow-up appointment?: No Do you understand care options if your condition(s) worsen?: Yes-patient verbalized understanding    SIGNATURE: Yvonna Alanis, [place]

## 2022-12-30 ENCOUNTER — Ambulatory Visit (INDEPENDENT_AMBULATORY_CARE_PROVIDER_SITE_OTHER): Payer: BC Managed Care – PPO | Admitting: Nurse Practitioner

## 2022-12-30 ENCOUNTER — Encounter: Payer: Self-pay | Admitting: Nurse Practitioner

## 2022-12-30 ENCOUNTER — Ambulatory Visit: Payer: Self-pay

## 2022-12-30 VITALS — BP 103/69 | HR 92 | Temp 100.0°F | Wt 177.4 lb

## 2022-12-30 DIAGNOSIS — N12 Tubulo-interstitial nephritis, not specified as acute or chronic: Secondary | ICD-10-CM

## 2022-12-30 NOTE — Progress Notes (Signed)
BP 103/69   Pulse 92   Temp 100 F (37.8 C) (Oral)   Wt 177 lb 6.4 oz (80.5 kg)   SpO2 97%   BMI 29.52 kg/m    Subjective:    Patient ID: Kimberly Neal, female    DOB: May 10, 1986, 37 y.o.   MRN: [id number]  HPI: Kimberly Neal is a 37 y.o. female  Chief Complaint  Patient presents with   ER Follow Up    Pt states she is currently on treatment for a UTI. States she woke up with sweats this morning around 3:00 AM, states she had a temp of 101. States that she took a shower after she sweating episode and feels somewhat better now.    URINARY SYMPTOMS Patient states she had 5 doses of ciprofloxacin.  Woke up this morning around 3 am with a fever of 101.  She took medicine to take it down and around 4 so was still dripping sweat.  Felt shaky.  She probably drank around 40 ounces of water.     Relevant past medical, surgical, family and social history reviewed and updated as indicated. Interim medical history since our last visit reviewed. Allergies and medications reviewed and updated.  Review of Systems  Constitutional:  Positive for chills, fatigue and fever.    Per HPI unless specifically indicated above     Objective:    BP 103/69   Pulse 92   Temp 100 F (37.8 C) (Oral)   Wt 177 lb 6.4 oz (80.5 kg)   SpO2 97%   BMI 29.52 kg/m   Wt Readings from Last 3 Encounters:  12/30/22 177 lb 6.4 oz (80.5 kg)  06/25/22 172 lb 12.8 oz (78.4 kg)  05/09/22 172 lb 4.8 oz (78.2 kg)    Physical Exam Vitals and nursing note reviewed.  Constitutional:      General: She is not in acute distress.    Appearance: Normal appearance. She is normal weight. She is not ill-appearing, toxic-appearing or diaphoretic.  HENT:     Head: Normocephalic.     Right Ear: External ear normal.     Left Ear: External ear normal.     Nose: Nose normal.     Mouth/Throat:     Mouth: Mucous membranes are moist.     Pharynx: Oropharynx is clear.  Eyes:     General:        Right eye: No  discharge.        Left eye: No discharge.     Extraocular Movements: Extraocular movements intact.     Conjunctiva/sclera: Conjunctivae normal.     Pupils: Pupils are equal, round, and reactive to light.  Cardiovascular:     Rate and Rhythm: Normal rate and regular rhythm.     Heart sounds: No murmur heard. Pulmonary:     Effort: Pulmonary effort is normal. No respiratory distress.     Breath sounds: Normal breath sounds. No wheezing or rales.  Musculoskeletal:     Cervical back: Normal range of motion and neck supple.  Skin:    General: Skin is warm and dry.     Capillary Refill: Capillary refill takes less than 2 seconds.  Neurological:     General: No focal deficit present.     Mental Status: She is alert and oriented to person, place, and time. Mental status is at baseline.  Psychiatric:        Mood and Affect: Mood normal.  Behavior: Behavior normal.        Thought Content: Thought content normal.        Judgment: Judgment normal.     Results for orders placed or performed in visit on 06/25/22  T4, free  Result Value Ref Range   Free T4 1.12 0.82 - 1.77 ng/dL  TSH  Result Value Ref Range   TSH 0.612 0.450 - 4.500 uIU/mL      Assessment & Plan:   Problem List Items Addressed This Visit   None Visit Diagnoses     Pyelonephritis    -  Primary   Complete course of ciprofloxacin. Increase water intake. Obtained CMP, CBC and UC in office. Will make recommendations based on labs results.   Relevant Orders   Comp Met (CMET)   CBC w/Diff   Urine Culture        Follow up plan: Return if symptoms worsen or fail to improve.

## 2022-12-30 NOTE — Telephone Encounter (Signed)
  Chief Complaint: Lightheaded - low temp Symptoms: Feels a bit lightheaded. Temp 96 per head thermometer Frequency: today Pertinent Negatives: Patient denies  Disposition: [] ED /[] Urgent Care (no appt availability in office) / [x] Appointment(In office/virtual)/ []  [facility]/ [] Home Care/ [] Refused Recommended Disposition /[] [facility]/ []  Follow-up with PCP Additional Notes: PT was seen recently for UTI, and given Cipro. Pt had fever of 101. Fever broke with 1-2 hours of sweating. PT now feels a bit shaky and lightheaded. Pt states that temp is 96.6 degrees. Per head scan thermometer.    Summary: Temperature and unsteady advice   Patient is calling received a call from a nurse following going to the hospital. Last night fever was 101 at 3am. Currently temp is 96.6. Pt feels shaky and unsteady. Please advise     Reason for Disposition  [1] MODERATE dizziness (e.g., interferes with normal activities) AND [2] has NOT been evaluated by doctor (or NP/PA) for this  (Exception: Dizziness caused by heat exposure, sudden standing, or poor fluid intake.)  Answer Assessment - Initial Assessment Questions 1. DESCRIPTION: "Describe your dizziness."     Lightheaded 2. LIGHTHEADED: "Do you feel lightheaded?" (e.g., somewhat faint, woozy, weak upon standing)     Standing up light headed 3. VERTIGO: "Do you feel like either you or the room is spinning or tilting?" (i.e. vertigo)     no 4. SEVERITY: "How bad is it?"  "Do you feel like you are going to faint?" "Can you stand and walk?"   - MILD: Feels slightly dizzy, but walking normally.   - MODERATE: Feels unsteady when walking, but not falling; interferes with normal activities (e.g., school, work).   - SEVERE: Unable to walk without falling, or requires assistance to walk without falling; feels like passing out now.      Mild - moderate 5. ONSET:  "When did the dizziness begin?"     Today 6. AGGRAVATING FACTORS: "Does  anything make it worse?" (e.g., standing, change in head position)     Walking upstairs. 7. HEART RATE: "Can you tell me your heart rate?" "How many beats in 15 seconds?"  (Note: not all patients can do this)       90-100 8. CAUSE: "What do you think is causing the dizziness?"     unsure 9. RECURRENT SYMPTOM: "Have you had dizziness before?" If Yes, ask: "When was the last time?" "What happened that time?"      10. OTHER SYMPTOMS: "Do you have any other symptoms?" (e.g., fever, chest pain, vomiting, diarrhea, bleeding)       no  Protocols used: Dizziness - Lightheadedness-A-AH

## 2022-12-31 LAB — COMPREHENSIVE METABOLIC PANEL
ALT: 31 IU/L (ref 0–32)
AST: 24 IU/L (ref 0–40)
Albumin/Globulin Ratio: 1.8 (ref 1.2–2.2)
Albumin: 3.8 g/dL — ABNORMAL LOW (ref 3.9–4.9)
Alkaline Phosphatase: 64 IU/L (ref 44–121)
BUN/Creatinine Ratio: 11 (ref 9–23)
BUN: 6 mg/dL (ref 6–20)
Bilirubin Total: 0.2 mg/dL (ref 0.0–1.2)
CO2: 24 mmol/L (ref 20–29)
Calcium: 8.7 mg/dL (ref 8.7–10.2)
Chloride: 102 mmol/L (ref 96–106)
Creatinine, Ser: 0.55 mg/dL — ABNORMAL LOW (ref 0.57–1.00)
Globulin, Total: 2.1 g/dL (ref 1.5–4.5)
Glucose: 91 mg/dL (ref 70–99)
Potassium: 3.4 mmol/L — ABNORMAL LOW (ref 3.5–5.2)
Sodium: 141 mmol/L (ref 134–144)
Total Protein: 5.9 g/dL — ABNORMAL LOW (ref 6.0–8.5)
eGFR: 120 mL/min/{1.73_m2} (ref >59)

## 2022-12-31 LAB — CBC WITH DIFFERENTIAL/PLATELET
Basophils Absolute: 0 10*3/uL (ref 0.0–0.2)
Basos: 0 %
EOS (ABSOLUTE): 0.1 10*3/uL (ref 0.0–0.4)
Eos: 1 %
Hematocrit: 33.7 % — ABNORMAL LOW (ref 34.0–46.6)
Hemoglobin: 11.9 g/dL (ref 11.1–15.9)
Immature Grans (Abs): 0 10*3/uL (ref 0.0–0.1)
Immature Granulocytes: 0 %
Lymphocytes Absolute: 1.7 10*3/uL (ref 0.7–3.1)
Lymphs: 17 %
MCH: 32.5 pg (ref 26.6–33.0)
MCHC: 35.3 g/dL (ref 31.5–35.7)
MCV: 92 fL (ref 79–97)
Monocytes Absolute: 1.3 10*3/uL — ABNORMAL HIGH (ref 0.1–0.9)
Monocytes: 12 %
Neutrophils Absolute: 7.2 10*3/uL — ABNORMAL HIGH (ref 1.4–7.0)
Neutrophils: 70 %
Platelets: 196 10*3/uL (ref 150–450)
RBC: 3.66 x10E6/uL — ABNORMAL LOW (ref 3.77–5.28)
RDW: 12.1 % (ref 11.7–15.4)
WBC: 10.3 10*3/uL (ref 3.4–10.8)

## 2022-12-31 NOTE — Progress Notes (Signed)
Hi Kimberly Neal. It was nice to see you yesterday.  Your lab work looks good.  Make sure you are drinking plenty of water.  Your white count does not show an infection but likely some stress on the body.  Continue with your current medication regimen.  Follow up as discussed.  Please let me know if you have any questions.

## 2023-01-01 LAB — URINE CULTURE

## 2023-01-01 NOTE — Progress Notes (Signed)
Hi Kimberly Neal.  I hope you are feeling better.  There were no additional bacteria growing in your urine which is good news.  Complete the course of ciprofloxacin.

## 2023-01-06 ENCOUNTER — Encounter: Payer: Self-pay | Admitting: Podiatry

## 2023-01-06 ENCOUNTER — Ambulatory Visit (INDEPENDENT_AMBULATORY_CARE_PROVIDER_SITE_OTHER): Payer: BC Managed Care – PPO | Admitting: Podiatry

## 2023-01-06 VITALS — BP 118/65 | HR 78

## 2023-01-06 DIAGNOSIS — Z9889 Other specified postprocedural states: Secondary | ICD-10-CM

## 2023-01-06 NOTE — Progress Notes (Signed)
   No chief complaint on file.   Subjective:  Kimberly Neal presents today status post bunionectomy with metarsal and phalangeal osteotomy right.  DOS: 12/15/2022.  Dr. Allena Katz surgeon.  Kimberly Neal doing well.  WBAT cam boot.  Presenting for suture removal  Past Medical History:  Diagnosis Date   DDD (degenerative disc disease), lumbar    HPV in female    Smoker    UTI (urinary tract infection)     Past Surgical History:  Procedure Laterality Date   BUNIONECTOMY Right 12/15/2022   TONSILLECTOMY AND ADENOIDECTOMY     TUBAL LIGATION  01/10/2018    No Known Allergies  Objective/Physical Exam Neurovascular status intact.  Incision well coapted with sutures intact. No sign of infectious process noted. No dehiscence. No active bleeding noted.  Moderate edema noted to the surgical extremity.   Assessment: 1. s/p bunionectomy with double osteotomy right. DOS: 12/15/2022.  Dr. Allena Katz surgeon  -Kimberly Neal evaluated -Continue WBAT cam boot -Compression ankle sleeves dispensed.  Wear daily -Return to clinic 3 weeks follow-up x-ray with Dr. Boyd Kerbs, DPM [facility]  Dr. Felecia Shelling, DPM    [address] [place], [place] [address]                Office [phone]  Fax [phone]

## 2023-01-09 ENCOUNTER — Other Ambulatory Visit: Payer: Self-pay | Admitting: Podiatry

## 2023-01-09 NOTE — Telephone Encounter (Signed)
Please advise 

## 2023-01-12 ENCOUNTER — Other Ambulatory Visit: Payer: Self-pay | Admitting: Podiatry

## 2023-01-12 MED ORDER — OXYCODONE-ACETAMINOPHEN 5-325 MG PO TABS: 1.0000 | ORAL_TABLET | Freq: Three times a day (TID) | ORAL | 0 refills | Status: DC | PRN

## 2023-01-12 NOTE — Progress Notes (Signed)
PRN postop 

## 2023-02-03 ENCOUNTER — Encounter: Payer: BC Managed Care – PPO | Admitting: Podiatry

## 2023-02-03 ENCOUNTER — Ambulatory Visit (INDEPENDENT_AMBULATORY_CARE_PROVIDER_SITE_OTHER): Payer: BC Managed Care – PPO | Admitting: Podiatry

## 2023-02-03 DIAGNOSIS — Z9889 Other specified postprocedural states: Secondary | ICD-10-CM

## 2023-02-03 NOTE — Progress Notes (Signed)
   Chief Complaint  Patient presents with   Routine Post Op    Pt stated that she has a lot of never pain    Subjective:  Patient presents today status post bunionectomy with metarsal and phalangeal osteotomy right.  DOS: 12/15/2022.  She states she is doing well.  She has been doing range of motion exercises.  Which has improved.  Past Medical History:  Diagnosis Date   DDD (degenerative disc disease), lumbar    HPV in female    Smoker    UTI (urinary tract infection)     Past Surgical History:  Procedure Laterality Date   BUNIONECTOMY Right 12/15/2022   TONSILLECTOMY AND ADENOIDECTOMY     TUBAL LIGATION  01/10/2018    No Known Allergies  Objective/Physical Exam Neurovascular status intact.  Incision well coapted with sutures intact. No sign of infectious process noted. No dehiscence. No active bleeding noted.  Moderate edema noted to the surgical extremity.   Assessment: 1. s/p bunionectomy with double osteotomy right. DOS: 12/15/2022.  Dr. Allena Katz surgeon  -Clinically healed and officially discharged from my care if any foot and ankle issues on future she will come back and see me. She will pick up orthotics from [place].  I encouraged her to obtain them.

## 2023-02-10 ENCOUNTER — Encounter: Payer: Self-pay | Admitting: Podiatry

## 2023-02-16 ENCOUNTER — Telehealth (INDEPENDENT_AMBULATORY_CARE_PROVIDER_SITE_OTHER): Payer: BC Managed Care – PPO | Admitting: Nurse Practitioner

## 2023-02-16 ENCOUNTER — Encounter: Payer: Self-pay | Admitting: Nurse Practitioner

## 2023-02-16 DIAGNOSIS — F419 Anxiety disorder, unspecified: Secondary | ICD-10-CM

## 2023-02-16 MED ORDER — ALPRAZOLAM 0.5 MG PO TABS: 0.5000 mg | ORAL_TABLET | Freq: Two times a day (BID) | ORAL | 0 refills | Status: DC | PRN

## 2023-02-16 NOTE — Assessment & Plan Note (Signed)
Chronic.  Exacerbated at this time due to recent death in the family.  Will send in Xanax to help with short term anxiety.  If still ongoing in about a month will need to discuss long term medication options.  Follow up if not improved.

## 2023-02-16 NOTE — Progress Notes (Signed)
There were no vitals taken for this visit.   Subjective:    Patient ID: Kimberly Neal, female    DOB: 1986-04-18, 37 y.o.   MRN: [id number]  HPI: Kimberly Neal is a 37 y.o. female  Chief Complaint  Patient presents with   Anxiety   ANXIETY Patient states her grandfather passed away over the weekend.  She has been very anxious since then.  She feels like this is a short term thing.  However, she has had a lot of death in her family over the last year.  Denies SI.    Flowsheet Row Office Visit from 12/30/2022 in [facility]  PHQ-9 Total Score 10         12/30/2022    1:19 PM 06/25/2022    1:25 PM 02/27/2022    4:11 PM 02/13/2022    3:50 PM  GAD 7 : Generalized Anxiety Score  Nervous, Anxious, on Edge 2 3 3 3   Control/stop worrying 2 3 1 3   Worry too much - different things 2 3 1 3   Trouble relaxing 2 3 3 3   Restless 2 3 2 3   Easily annoyed or irritable 2 3 1 3   Afraid - awful might happen 2 2 1 2   Total GAD 7 Score 14 20 12 20   Anxiety Difficulty Somewhat difficult Somewhat difficult Somewhat difficult Somewhat difficult      Relevant past medical, surgical, family and social history reviewed and updated as indicated. Interim medical history since our last visit reviewed. Allergies and medications reviewed and updated.  Review of Systems  Psychiatric/Behavioral:  Negative for dysphoric mood. The patient is nervous/anxious.     Per HPI unless specifically indicated above     Objective:    There were no vitals taken for this visit.  Wt Readings from Last 3 Encounters:  12/30/22 177 lb 6.4 oz (80.5 kg)  06/25/22 172 lb 12.8 oz (78.4 kg)  05/09/22 172 lb 4.8 oz (78.2 kg)    Physical Exam Vitals and nursing note reviewed.  HENT:     Head: Normocephalic.     Right Ear: Hearing normal.     Left Ear: Hearing normal.     Nose: Nose normal.  Eyes:     Pupils: Pupils are equal, round, and reactive to light.  Pulmonary:      Effort: Pulmonary effort is normal. No respiratory distress.  Neurological:     Mental Status: She is alert.  Psychiatric:        Mood and Affect: Mood normal.        Behavior: Behavior normal.        Thought Content: Thought content normal.        Judgment: Judgment normal.     Results for orders placed or performed in visit on 12/30/22  Urine Culture   Specimen: Urine   UR  Result Value Ref Range   Urine Culture, Routine Final report    Organism ID, Bacteria Comment   Comp Met (CMET)  Result Value Ref Range   Glucose 91 70 - 99 mg/dL   BUN 6 6 - 20 mg/dL   Creatinine, Ser 4.09 (L) 0.57 - 1.00 mg/dL   eGFR 811 >91 YN/WGN/5.62   BUN/Creatinine Ratio 11 9 - 23   Sodium 141 134 - 144 mmol/L   Potassium 3.4 (L) 3.5 - 5.2 mmol/L   Chloride 102 96 - 106 mmol/L   CO2 24 20 - 29 mmol/L   Calcium  8.7 8.7 - 10.2 mg/dL   Total Protein 5.9 (L) 6.0 - 8.5 g/dL   Albumin 3.8 (L) 3.9 - 4.9 g/dL   Globulin, Total 2.1 1.5 - 4.5 g/dL   Albumin/Globulin Ratio 1.8 1.2 - 2.2   Bilirubin Total 0.2 0.0 - 1.2 mg/dL   Alkaline Phosphatase 64 44 - 121 IU/L   AST 24 0 - 40 IU/L   ALT 31 0 - 32 IU/L  CBC w/Diff  Result Value Ref Range   WBC 10.3 3.4 - 10.8 x10E3/uL   RBC 3.66 (L) 3.77 - 5.28 x10E6/uL   Hemoglobin 11.9 11.1 - 15.9 g/dL   Hematocrit 16.1 (L) 09.6 - 46.6 %   MCV 92 79 - 97 fL   MCH 32.5 26.6 - 33.0 pg   MCHC 35.3 31.5 - 35.7 g/dL   RDW 04.5 40.9 - 81.1 %   Platelets 196 150 - 450 x10E3/uL   Neutrophils 70 Not Estab. %   Lymphs 17 Not Estab. %   Monocytes 12 Not Estab. %   Eos 1 Not Estab. %   Basos 0 Not Estab. %   Neutrophils Absolute 7.2 (H) 1.4 - 7.0 x10E3/uL   Lymphocytes Absolute 1.7 0.7 - 3.1 x10E3/uL   Monocytes Absolute 1.3 (H) 0.1 - 0.9 x10E3/uL   EOS (ABSOLUTE) 0.1 0.0 - 0.4 x10E3/uL   Basophils Absolute 0.0 0.0 - 0.2 x10E3/uL   Immature Granulocytes 0 Not Estab. %   Immature Grans (Abs) 0.0 0.0 - 0.1 x10E3/uL      Assessment & Plan:   Problem List Items  Addressed This Visit       Other   Anxiety - Primary    Chronic.  Exacerbated at this time due to recent death in the family.  Will send in Xanax to help with short term anxiety.  If still ongoing in about a month will need to discuss long term medication options.  Follow up if not improved.      Relevant Medications   ALPRAZolam (XANAX) 0.5 MG tablet     Follow up plan: No follow-ups on file.   This visit was completed via MyChart due to the restrictions of the COVID-19 pandemic. All issues as above were discussed and addressed. Physical exam was done as above through visual confirmation on MyChart. If it was felt that the patient should be evaluated in the office, they were directed there. The patient verbally consented to this visit. Location of the patient: Home Location of the provider: Office Those involved with this call:  Provider: Larae Grooms, NP CMA: Maggie Font, CMA Front Desk/Registration: Servando Snare This encounter was conducted via video.  I spent 20 dedicated to the care of this patient on the date of this encounter to include previsit review of symptoms, plan of care and follow up, face to face time with the patient, and post visit ordering of testing.

## 2023-02-16 NOTE — Telephone Encounter (Signed)
Patient scheduled for a visit today.

## 2023-05-08 ENCOUNTER — Telehealth: Payer: Self-pay | Admitting: Podiatry

## 2023-05-08 NOTE — Telephone Encounter (Signed)
Lmom for pt to call back to schedule picking up her orthotics , they are currently in the [place] office

## 2023-05-21 ENCOUNTER — Encounter: Payer: Self-pay | Admitting: Nurse Practitioner

## 2023-05-21 ENCOUNTER — Ambulatory Visit (INDEPENDENT_AMBULATORY_CARE_PROVIDER_SITE_OTHER): Payer: BC Managed Care – PPO | Admitting: Nurse Practitioner

## 2023-05-21 VITALS — BP 111/75 | HR 77 | Temp 98.5°F | Wt 174.6 lb

## 2023-05-21 DIAGNOSIS — R7989 Other specified abnormal findings of blood chemistry: Secondary | ICD-10-CM

## 2023-05-21 DIAGNOSIS — N926 Irregular menstruation, unspecified: Secondary | ICD-10-CM

## 2023-05-21 DIAGNOSIS — N760 Acute vaginitis: Secondary | ICD-10-CM | POA: Diagnosis not present

## 2023-05-21 DIAGNOSIS — M545 Low back pain, unspecified: Secondary | ICD-10-CM

## 2023-05-21 DIAGNOSIS — R8281 Pyuria: Secondary | ICD-10-CM

## 2023-05-21 DIAGNOSIS — B9689 Other specified bacterial agents as the cause of diseases classified elsewhere: Secondary | ICD-10-CM | POA: Insufficient documentation

## 2023-05-21 LAB — URINALYSIS, ROUTINE W REFLEX MICROSCOPIC
Bilirubin, UA: NEGATIVE
Glucose, UA: NEGATIVE
Ketones, UA: NEGATIVE
Leukocytes,UA: NEGATIVE
Nitrite, UA: NEGATIVE
Protein,UA: NEGATIVE
Specific Gravity, UA: 1.01 (ref 1.005–1.030)
Urobilinogen, Ur: 0.2 mg/dL (ref 0.2–1.0)
pH, UA: 6.5 (ref 5.0–7.5)

## 2023-05-21 LAB — WET PREP FOR TRICH, YEAST, CLUE
Clue Cell Exam: POSITIVE — AB
Trichomonas Exam: NEGATIVE
Yeast Exam: NEGATIVE

## 2023-05-21 LAB — MICROSCOPIC EXAMINATION

## 2023-05-21 MED ORDER — METRONIDAZOLE 500 MG PO TABS: 500.0000 mg | ORAL_TABLET | Freq: Two times a day (BID) | ORAL | 0 refills | Status: AC

## 2023-05-21 MED ORDER — METHOCARBAMOL 750 MG PO TABS: 750.0000 mg | ORAL_TABLET | Freq: Three times a day (TID) | ORAL | 0 refills | Status: DC | PRN

## 2023-05-21 NOTE — Assessment & Plan Note (Signed)
Recheck levels today due to recent menstrual changes and frequent diarrhea.
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
## 2023-05-21 NOTE — Assessment & Plan Note (Signed)
Acute -- UA showing bacteria and 3+ BLD (is on cycle).  Will send for culture and treat as needed.  Wet prep + clue cells.  Educated patient on this diagnosis and treatment.  Flagyl 500 MG BID for 7 days sent in.  Suspect this is exacerbating back discomfort.  Educated on how to take Flagyl and recommend taking probiotic while taking abx therapy.

## 2023-05-21 NOTE — Patient Instructions (Signed)
Look into [employer] Stress  Managing Anxiety, Adult After being diagnosed with anxiety, you may be relieved to know why you have felt or behaved a certain way. You may also feel overwhelmed about the treatment ahead and what it will mean for your life. With care and support, you can manage your anxiety. How to manage lifestyle changes Understanding the difference between stress and anxiety Although stress can play a role in anxiety, it is not the same as anxiety. Stress is your body's reaction to life changes and events, both good and bad. Stress is often caused by something external, such as a deadline, test, or competition. It normally goes away after the event has ended and will last just a few hours. But, stress can be ongoing and can lead to more than just stress. Anxiety is caused by something internal, such as imagining a terrible outcome or worrying that something will go wrong that will greatly upset you. Anxiety often does not go away even after the event is over, and it can become a long-term (chronic) worry. Lowering stress and anxiety Talk with your health care provider or a counselor to learn more about lowering anxiety and stress. They may suggest tension-reduction techniques, such as: Music. Spend time creating or listening to music that you enjoy and that inspires you. Mindfulness-based meditation. Practice being aware of your normal breaths while not trying to control your breathing. It can be done while sitting or walking. Centering prayer. Focus on a word, phrase, or sacred image that means something to you and brings you peace. Deep breathing. Expand your stomach and inhale slowly through your nose. Hold your breath for 3-5 seconds. Then breathe out slowly, letting your stomach muscles relax. Self-talk. Learn to notice and spot thought patterns that lead to anxiety reactions. Change those patterns to thoughts that feel peaceful. Muscle relaxation. Take time to tense muscles and  then relax them. Choose a tension-reduction technique that fits your lifestyle and personality. These techniques take time and practice. Set aside 5-15 minutes a day to do them. Specialized therapists can offer counseling and training in these techniques. The training to help with anxiety may be covered by some insurance plans. Other things you can do to manage stress and anxiety include: Keeping a stress diary. This can help you learn what triggers your reaction and then learn ways to manage your response. Thinking about how you react to certain situations. You may not be able to control everything, but you can control your response. Making time for activities that help you relax and not feeling guilty about spending your time in this way. Doing visual imagery. This involves imagining or creating mental pictures to help you relax. Practicing yoga. Through yoga poses, you can lower tension and relax.  Medicines Medicines for anxiety include: Antidepressant medicines. These are usually prescribed for long-term daily control. Anti-anxiety medicines. These may be added in severe cases, especially when panic attacks occur. When used together, medicines, psychotherapy, and tension-reduction techniques may be the most effective treatment. Relationships Relationships can play a big part in helping you recover. Spend more time connecting with trusted friends and family members. Think about going to couples counseling if you have a partner, taking family education classes, or going to family therapy. Therapy can help you and others better understand your anxiety. How to recognize changes in your anxiety Everyone responds differently to treatment for anxiety. Recovery from anxiety happens when symptoms lessen and stop interfering with your daily life at home or  work. This may mean that you will start to: Have better concentration and focus. Worry will interfere less in your daily thinking. Sleep better. Be  less irritable. Have more energy. Have improved memory. Try to recognize when your condition is getting worse. Contact your provider if your symptoms interfere with home or work and you feel like your condition is not improving. Follow these instructions at home: Activity Exercise. Adults should: Exercise for at least 150 minutes each week. The exercise should increase your heart rate and make you sweat (moderate-intensity exercise). Do strengthening exercises at least twice a week. Get the right amount and quality of sleep. Most adults need 7-9 hours of sleep each night. Lifestyle  Eat a healthy diet that includes plenty of vegetables, fruits, whole grains, low-fat dairy products, and lean protein. Do not eat a lot of foods that are high in fats, added sugars, or salt (sodium). Make choices that simplify your life. Do not use any products that contain nicotine or tobacco. These products include cigarettes, chewing tobacco, and vaping devices, such as e-cigarettes. If you need help quitting, ask your provider. Avoid caffeine, alcohol, and certain over-the-counter cold medicines. These may make you feel worse. Ask your pharmacist which medicines to avoid. General instructions Take over-the-counter and prescription medicines only as told by your provider. Keep all follow-up visits. This is to make sure you are managing your anxiety well or if you need more support. Where to find support You can get help and support from: Self-help groups. Online and [employer]. A trusted spiritual leader. Couples counseling. Family education classes. Family therapy. Where to find more information You may find that joining a support group helps you deal with your anxiety. The following sources can help you find counselors or support groups near you: Mental Health America: mentalhealthamerica.net Anxiety and Depression Association of [place] (ADAA): adaa.org The [employer] on Mental  Illness (NAMI): nami.org Contact a health care provider if: You have a hard time staying focused or finishing tasks. You spend many hours a day feeling worried about everyday life. You are very tired because you cannot stop worrying. You start to have headaches or often feel tense. You have chronic nausea or diarrhea. Get help right away if: Your heart feels like it is racing. You have shortness of breath. You have thoughts of hurting yourself or others. Get help right away if you feel like you may hurt yourself or others, or have thoughts about taking your own life. Go to your nearest emergency room or: Call 911. Call the National Suicide Prevention Lifeline at 5598117408 or 988. This is open 24 hours a day. Text the Crisis Text Line at 515-672-5891. This information is not intended to replace advice given to you by your health care provider. Make sure you discuss any questions you have with your health care provider. Document Revised: 06/24/2022 Document Reviewed: 01/06/2021 Elsevier Patient Education  2024 [employer].

## 2023-05-21 NOTE — Assessment & Plan Note (Signed)
At baseline cycles are regular, but currently not.  She is having other perimenopausal symptoms with this.  Will check thyroid labs + hormone levels today & CBC/CMP.  Determine next steps after return of labs.  May need imaging if ongoing irregular cycles.

## 2023-05-21 NOTE — Assessment & Plan Note (Addendum)
Acute over past days, may be exacerbated by BV infection. Has know degenerative disc lumbar spine.  Will send in Robaxin to use as needed, recommend not to use if driving or at work.  May continue Ibuprofen or Tylenol at home.  Recommend she perform lower back PT exercises at home, discussed places online to find these stretches.  Heating pad as needed and rest.  Return if worsening or ongoing.  No red flags.

## 2023-05-22 NOTE — Progress Notes (Signed)
Contacted via MyChart   Good morning Kimberly Neal, your labs are slowly returning, some still pending: - Kidney function, creatinine and eGFR, remains normal, as is liver function, AST and ALT.  - Thyroid labs remain in normal range. - FSH/LH show no menopause, however these labs vary on information.  You could be in perimenopause as we discussed. - CBC shows no anemia, good news. - Waiting on estrogen level to improve.  Any questions? Keep being amazing!!  Thank you for allowing me to participate in your care.  I appreciate you. Kindest regards, Marija Calamari

## 2023-05-24 NOTE — Progress Notes (Signed)
Contacted via MyChart   Good morning Kimberly Neal, your urine appears to have infection present too.  I am waiting on final culture and when this returns I will send in antibiotic for this as well.  Any questions?

## 2023-05-26 ENCOUNTER — Other Ambulatory Visit: Payer: Self-pay | Admitting: Nurse Practitioner

## 2023-05-26 MED ORDER — AMOXICILLIN-POT CLAVULANATE 500-125 MG PO TABS: 1.0000 | ORAL_TABLET | Freq: Two times a day (BID) | ORAL | 0 refills | Status: AC

## 2023-05-26 NOTE — Progress Notes (Signed)
Contacted via MyChart   Good morning Kimberly Neal, it looks like a urine infection is also present.  I am going to send in Augmentin for you to start taking for this.  All of this is probably why you had back pain, you had two infections present.  Any questions?  There is still some culture pending and if we need to change anything I will let you know.

## 2023-05-27 LAB — URINE CULTURE

## 2023-05-27 LAB — COMPREHENSIVE METABOLIC PANEL
ALT: 9 IU/L (ref 0–32)
AST: 19 IU/L (ref 0–40)
Albumin: 4.3 g/dL (ref 3.9–4.9)
Alkaline Phosphatase: 45 IU/L (ref 44–121)
BUN/Creatinine Ratio: 7 — ABNORMAL LOW (ref 9–23)
BUN: 5 mg/dL — ABNORMAL LOW (ref 6–20)
Bilirubin Total: 0.3 mg/dL (ref 0.0–1.2)
CO2: 23 mmol/L (ref 20–29)
Calcium: 8.9 mg/dL (ref 8.7–10.2)
Chloride: 106 mmol/L (ref 96–106)
Creatinine, Ser: 0.71 mg/dL (ref 0.57–1.00)
Globulin, Total: 1.9 g/dL (ref 1.5–4.5)
Glucose: 81 mg/dL (ref 70–99)
Potassium: 3.8 mmol/L (ref 3.5–5.2)
Sodium: 142 mmol/L (ref 134–144)
Total Protein: 6.2 g/dL (ref 6.0–8.5)
eGFR: 111 mL/min/{1.73_m2} (ref >59)

## 2023-05-27 LAB — T4, FREE: Free T4: 1.3 ng/dL (ref 0.82–1.77)

## 2023-05-27 LAB — CBC WITH DIFFERENTIAL/PLATELET
Basophils Absolute: 0 10*3/uL (ref 0.0–0.2)
Basos: 1 %
EOS (ABSOLUTE): 0.1 10*3/uL (ref 0.0–0.4)
Eos: 1 %
Hematocrit: 37.7 % (ref 34.0–46.6)
Hemoglobin: 13 g/dL (ref 11.1–15.9)
Immature Grans (Abs): 0 10*3/uL (ref 0.0–0.1)
Immature Granulocytes: 0 %
Lymphocytes Absolute: 2.9 10*3/uL (ref 0.7–3.1)
Lymphs: 40 %
MCH: 32.6 pg (ref 26.6–33.0)
MCHC: 34.5 g/dL (ref 31.5–35.7)
MCV: 95 fL (ref 79–97)
Monocytes Absolute: 0.5 10*3/uL (ref 0.1–0.9)
Monocytes: 7 %
Neutrophils Absolute: 3.7 10*3/uL (ref 1.4–7.0)
Neutrophils: 51 %
Platelets: 236 10*3/uL (ref 150–450)
RBC: 3.99 x10E6/uL (ref 3.77–5.28)
RDW: 12.2 % (ref 11.7–15.4)
WBC: 7.3 10*3/uL (ref 3.4–10.8)

## 2023-05-27 LAB — ESTROGENS, TOTAL: Estrogen: 84 pg/mL

## 2023-05-27 LAB — FSH/LH
FSH: 12.3 m[IU]/mL
LH: 6.2 m[IU]/mL

## 2023-05-27 LAB — TSH: TSH: 0.709 u[IU]/mL (ref 0.450–4.500)

## 2023-07-04 DIAGNOSIS — M778 Other enthesopathies, not elsewhere classified: Secondary | ICD-10-CM | POA: Insufficient documentation

## 2023-07-04 DIAGNOSIS — M25512 Pain in left shoulder: Secondary | ICD-10-CM | POA: Insufficient documentation

## 2023-07-04 DIAGNOSIS — M19012 Primary osteoarthritis, left shoulder: Secondary | ICD-10-CM | POA: Insufficient documentation

## 2023-07-22 ENCOUNTER — Encounter: Payer: Self-pay | Admitting: Nurse Practitioner

## 2023-07-22 ENCOUNTER — Telehealth (INDEPENDENT_AMBULATORY_CARE_PROVIDER_SITE_OTHER): Payer: BC Managed Care – PPO | Admitting: Nurse Practitioner

## 2023-07-22 DIAGNOSIS — N760 Acute vaginitis: Secondary | ICD-10-CM

## 2023-07-22 DIAGNOSIS — B9689 Other specified bacterial agents as the cause of diseases classified elsewhere: Secondary | ICD-10-CM

## 2023-07-22 MED ORDER — METRONIDAZOLE 500 MG PO TABS: 500.0000 mg | ORAL_TABLET | Freq: Two times a day (BID) | ORAL | 0 refills | Status: AC

## 2023-07-22 NOTE — Progress Notes (Signed)
There were no vitals taken for this visit.   Subjective:    Patient ID: Kimberly Neal, female    DOB: March 03, 1986, 37 y.o.   MRN: [id number]  HPI: Kimberly Neal is a 36 y.o. female  Chief Complaint  Patient presents with   Dyspareunia   Patient states she isn't sure if she has a UTI or BV.  She has never had pain with intercourse with a UTI so she is leaning more towards BV. She denies foul smell after sex or discharge.   Relevant past medical, surgical, family and social history reviewed and updated as indicated. Interim medical history since our last visit reviewed. Allergies and medications reviewed and updated.  Review of Systems  Genitourinary:  Positive for dyspareunia. Negative for difficulty urinating and vaginal discharge.    Per HPI unless specifically indicated above     Objective:    There were no vitals taken for this visit.  Wt Readings from Last 3 Encounters:  05/21/23 174 lb 9.6 oz (79.2 kg)  12/30/22 177 lb 6.4 oz (80.5 kg)  06/25/22 172 lb 12.8 oz (78.4 kg)    Physical Exam Vitals and nursing note reviewed.  HENT:     Head: Normocephalic.     Right Ear: Hearing normal.     Left Ear: Hearing normal.     Nose: Nose normal.  Eyes:     Pupils: Pupils are equal, round, and reactive to light.  Pulmonary:     Effort: Pulmonary effort is normal. No respiratory distress.  Neurological:     Mental Status: She is alert.  Psychiatric:        Mood and Affect: Mood normal.        Behavior: Behavior normal.        Thought Content: Thought content normal.        Judgment: Judgment normal.     Results for orders placed or performed in visit on 05/21/23  WET PREP FOR TRICH, YEAST, CLUE   Specimen: Urine   Urine  Result Value Ref Range   Trichomonas Exam Negative Negative   Yeast Exam Negative Negative   Clue Cell Exam Positive (A) Negative  Microscopic Examination   Urine  Result Value Ref Range   WBC, UA 0-5 0 - 5 /hpf   RBC, Urine 11-30  (A) 0 - 2 /hpf   Epithelial Cells (non renal) 0-10 0 - 10 /hpf   Bacteria, UA Many (A) None seen/Few  Urine Culture   Specimen: Urine   UR  Result Value Ref Range   Urine Culture, Routine Final report (A)    Organism ID, Bacteria Klebsiella pneumoniae (A)    ORGANISM ID, BACTERIA Not applicable    Antimicrobial Susceptibility Comment   Urinalysis, Routine w reflex microscopic  Result Value Ref Range   Specific Gravity, UA 1.010 1.005 - 1.030   pH, UA 6.5 5.0 - 7.5   Color, UA Yellow Yellow   Appearance Ur Cloudy (A) Clear   Leukocytes,UA Negative Negative   Protein,UA Negative Negative/Trace   Glucose, UA Negative Negative   Ketones, UA Negative Negative   RBC, UA 3+ (A) Negative   Bilirubin, UA Negative Negative   Urobilinogen, Ur 0.2 0.2 - 1.0 mg/dL   Nitrite, UA Negative Negative   Microscopic Examination See below:   T4, free  Result Value Ref Range   Free T4 1.30 0.82 - 1.77 ng/dL  TSH  Result Value Ref Range   TSH 0.709 0.450 - 4.500  uIU/mL  FSH/LH  Result Value Ref Range   LH 6.2 mIU/mL   FSH 12.3 mIU/mL  Estrogens, Total  Result Value Ref Range   Estrogen 84 pg/mL  CBC with Differential/Platelet  Result Value Ref Range   WBC 7.3 3.4 - 10.8 x10E3/uL   RBC 3.99 3.77 - 5.28 x10E6/uL   Hemoglobin 13.0 11.1 - 15.9 g/dL   Hematocrit 56.2 13.0 - 46.6 %   MCV 95 79 - 97 fL   MCH 32.6 26.6 - 33.0 pg   MCHC 34.5 31.5 - 35.7 g/dL   RDW 86.5 78.4 - 69.6 %   Platelets 236 150 - 450 x10E3/uL   Neutrophils 51 Not Estab. %   Lymphs 40 Not Estab. %   Monocytes 7 Not Estab. %   Eos 1 Not Estab. %   Basos 1 Not Estab. %   Neutrophils Absolute 3.7 1.4 - 7.0 x10E3/uL   Lymphocytes Absolute 2.9 0.7 - 3.1 x10E3/uL   Monocytes Absolute 0.5 0.1 - 0.9 x10E3/uL   EOS (ABSOLUTE) 0.1 0.0 - 0.4 x10E3/uL   Basophils Absolute 0.0 0.0 - 0.2 x10E3/uL   Immature Granulocytes 0 Not Estab. %   Immature Grans (Abs) 0.0 0.0 - 0.1 x10E3/uL  Comp Met (CMET)  Result Value Ref Range    Glucose 81 70 - 99 mg/dL   BUN 5 (L) 6 - 20 mg/dL   Creatinine, Ser 2.95 0.57 - 1.00 mg/dL   eGFR 284 >13 KG/MWN/0.27   BUN/Creatinine Ratio 7 (L) 9 - 23   Sodium 142 134 - 144 mmol/L   Potassium 3.8 3.5 - 5.2 mmol/L   Chloride 106 96 - 106 mmol/L   CO2 23 20 - 29 mmol/L   Calcium 8.9 8.7 - 10.2 mg/dL   Total Protein 6.2 6.0 - 8.5 g/dL   Albumin 4.3 3.9 - 4.9 g/dL   Globulin, Total 1.9 1.5 - 4.5 g/dL   Bilirubin Total 0.3 0.0 - 1.2 mg/dL   Alkaline Phosphatase 45 44 - 121 IU/L   AST 19 0 - 40 IU/L   ALT 9 0 - 32 IU/L      Assessment & Plan:   Problem List Items Addressed This Visit   None Visit Diagnoses     BV (bacterial vaginosis)    -  Primary   Will treat with flagyl. If not improved will check urine sample and treat patient for UTI.   Relevant Medications   metroNIDAZOLE (FLAGYL) 500 MG tablet        Follow up plan: No follow-ups on file.   This visit was completed via MyChart due to the restrictions of the COVID-19 pandemic. All issues as above were discussed and addressed. Physical exam was done as above through visual confirmation on MyChart. If it was felt that the patient should be evaluated in the office, they were directed there. The patient verbally consented to this visit. Location of the patient: Car Location of the provider: Office Those involved with this call:  Provider: Larae Grooms, NP CMA: NA Front Desk/Registration: Servando Snare This encounter was conducted via Video.  I spent 20 dedicated to the care of this patient on the date of this encounter to include previsit review of symptoms, plan of care and follow up, face to face time with the patient, and post visit ordering of testing.

## 2024-04-26 ENCOUNTER — Telehealth: Payer: Self-pay | Admitting: Nurse Practitioner

## 2024-04-26 NOTE — Telephone Encounter (Signed)
 Copied from CRM [id number]. Topic: Appointments - Scheduling Inquiry for Clinic >> Apr 26, 2024 10:17 AM Selinda RAMAN wrote: Reason for CRM: The patient called stating she was left a message asking if she in fact meant to schedule an appointment with a new provider. She was seeing Darice but she states yes in fact she did mean to schedule a new patient appointment with Jolene. Please contact her for any questions or concerns

## 2024-04-26 NOTE — Telephone Encounter (Signed)
 I spoke with patient she want to switch over to Jolene Psychologist, clinical Patient). I told her I would send this to you for follow up. She did not give any explanation as to why.

## 2024-05-16 ENCOUNTER — Ambulatory Visit
Admission: EM | Admit: 2024-05-16 | Discharge: 2024-05-16 | Disposition: A | Discharge status: Home / Self Care | Attending: Family Medicine | Admitting: Family Medicine

## 2024-05-16 ENCOUNTER — Ambulatory Visit (INDEPENDENT_AMBULATORY_CARE_PROVIDER_SITE_OTHER)

## 2024-05-16 ENCOUNTER — Ambulatory Visit: Payer: Self-pay

## 2024-05-16 DIAGNOSIS — S0992XA Unspecified injury of nose, initial encounter: Secondary | ICD-10-CM

## 2024-05-16 NOTE — Telephone Encounter (Signed)
 FYI Only or Action Required?: FYI only for provider.  Patient was last seen in primary care on 07/22/2023 by Melvin Pao, NP.  Called Nurse Triage reporting Pain.  Symptoms began yesterday.  Interventions attempted: Rest, hydration, or home remedies.  Symptoms are: gradually worsening.  Triage Disposition: See Physician Within 24 Hours  Patient/caregiver understands and will follow disposition?:    Copied from CRM #[id number]. Topic: Clinical - Red Word Triage >> May 16, 2024  9:06 AM Leonette SQUIBB wrote: Red Word that prompted transfer to Nurse Triage: pt thinks her dog broke her nose last night.  Agent transferred pt to nurse: patient not on line. Reason for Disposition  Tip of nose is very tender  Answer Assessment - Initial Assessment Questions 1. MECHANISM: How did the injury happen?      Dog and patient bumped 2. ONSET: When did the injury happen? (e.g., minutes, hours ago)      yesterday 3. LOCATION: What part of the nose is injured?      nose 4. APPEARANCE of INJURY: What does the nose look like?      Nose at bridge looks bruised and possibly broken 5. BLEEDING: Is the nose still bleeding? If Yes, ask: Is it difficult to stop?      Yes but was difficult to stop bleeding 6. SIZE: For cuts, bruises, or swelling, ask: How large is it? (e.g., inches or centimeters;  entire nose)      na 7. PAIN: Is it painful? If Yes, ask: How bad is the pain? (Scale 0-10; or none, mild, moderate, severe)     moderate 8. TETANUS: For any breaks in the skin, ask: When was your last tetanus booster?     na 9. OTHER SYMPTOMS: Do you have any other symptoms? (e.g., headache, neck pain, loss of consciousness)     Headache, nose pain 10. PREGNANCY: Is there any chance you are pregnant? When was your last menstrual period?       Na  Referred and scheduled patient appt with [facility]  Protocols used: Nose Injury-A-AH

## 2024-05-16 NOTE — Telephone Encounter (Signed)
 Forwarded to PCP for awareness.

## 2024-05-16 NOTE — Telephone Encounter (Signed)
 Spoke with patient regarding request. Advised her that I would speak with both providers and follow up by 05/17/24.

## 2024-05-16 NOTE — ED Provider Notes (Signed)
 [facility]    CSN: [id number] Arrival date & time: 05/16/24  1056      History   Chief Complaint Chief Complaint  Patient presents with   Facial Injury    HPI  HPI Kimberly Neal is a 38 y.o. female.   Prim presents for nasal injury with pain, edema and nose bleed after her dog hit her in the nose last night. She was bending over and the dog was jumping on her bed. Her dog's skull hit her in the nose. She had a nose bleed for about 10 minutes but stopped with pressure. Has headache and tenderness with the nose.  Has clear fluid out of her nose but has been getting congestion.      Past Medical History:  Diagnosis Date   DDD (degenerative disc disease), lumbar    HPV in female    Smoker    UTI (urinary tract infection)     Patient Active Problem List   Diagnosis Date Noted   Arthritis of left acromioclavicular joint 07/04/2023   Pain in joint of left shoulder 07/04/2023   Tendinitis of left shoulder 07/04/2023   Menstrual changes 05/21/2023   Bacterial vaginosis 05/21/2023   Strain of thoracic region 05/07/2022   Low serum thyroid stimulating hormone (TSH) 02/05/2022   Palpitations 02/05/2022   Depression, recurrent (HCC) 11/06/2021   Anxiety 11/06/2021   Nicotine dependence, chewing tobacco, uncomplicated    Hirsutism 09/17/2020   Esophageal spasm 08/31/2020   Acute bilateral low back pain without sciatica 06/28/2020   ASCUS with positive high risk HPV cervical 09/23/2017   Degenerative disc disease, lumbar 06/20/2015    Past Surgical History:  Procedure Laterality Date   BUNIONECTOMY Right 12/15/2022   TONSILLECTOMY AND ADENOIDECTOMY     TUBAL LIGATION  01/10/2018    OB History     Gravida  1   Para      Term      Preterm      AB      Living         SAB      IAB      Ectopic      Multiple      Live Births               Home Medications    Prior to Admission medications   Medication Sig Start Date End  Date Taking? Authorizing Provider  ALPRAZolam  (XANAX ) 0.5 MG tablet Take 1 tablet (0.5 mg total) by mouth 2 (two) times daily as needed for anxiety. Patient not taking: Reported on 05/21/2023 02/16/23   Melvin Pao, NP  ibuprofen  (ADVIL ) 800 MG tablet Take 1 tablet (800 mg total) by mouth every 6 (six) hours as needed. 12/15/22   Tobie Franky SQUIBB, DPM  methocarbamol  (ROBAXIN -750) 750 MG tablet Take 1 tablet (750 mg total) by mouth every 8 (eight) hours as needed for muscle spasms. 05/21/23   Cannady, Jolene T, NP    Family History Family History  Problem Relation Age of Onset   COPD Father    Leukemia Father    Hypertension Brother    Hyperlipidemia Brother    Asthma Son    Heart disease Maternal Grandmother    Diabetes Maternal Grandmother    Lung cancer Maternal Grandmother    Lung disease Maternal Grandfather    Kidney disease Paternal Grandmother    Diabetes Paternal Grandmother    Leukemia Paternal Grandfather     Social History Social History  Tobacco Use   Smoking status: Every Day    Current packs/day: 0.50    Types: Cigarettes   Smokeless tobacco: Never  Vaping Use   Vaping status: Never Used  Substance Use Topics   Alcohol use: Not Currently   Drug use: No     Allergies   Patient has no known allergies.   Review of Systems Review of Systems: :negative unless otherwise stated in HPI.      Physical Exam Triage Vital Signs ED Triage Vitals  Encounter Vitals Group     BP 05/16/24 1113 (!) 111/55     Girls Systolic BP Percentile --      Girls Diastolic BP Percentile --      Boys Systolic BP Percentile --      Boys Diastolic BP Percentile --      Pulse Rate 05/16/24 1113 83     Resp 05/16/24 1113 16     Temp 05/16/24 1113 98.6 F (37 C)     Temp Source 05/16/24 1113 Oral     SpO2 05/16/24 1113 98 %     Weight 05/16/24 1113 179 lb (81.2 kg)     Height 05/16/24 1113 5' 5 (1.651 m)     Head Circumference --      Peak Flow --      Pain Score  05/16/24 1122 4     Pain Loc --      Pain Education --      Exclude from Growth Chart --    No data found.  Updated Vital Signs BP (!) 111/55 (BP Location: Left Arm)   Pulse 83   Temp 98.6 F (37 C) (Oral)   Resp 16   Ht 5' 5 (1.651 m)   Wt 81.2 kg   LMP 05/14/2024 (Approximate)   SpO2 98%   BMI 29.79 kg/m   Visual Acuity Right Eye Distance:   Left Eye Distance:   Bilateral Distance:    Right Eye Near:   Left Eye Near:    Bilateral Near:     Physical Exam GEN: well appearing female in no acute distress  HENT: Moist mucous membranes, no hematoma, mild nasal edema, frontal nasal bridge tender to palpation, no blood in nares, no appreciable facial ecchymosis CVS: well perfused, regular rate RESP: speaking in full sentences without pause, no respiratory distress     UC Treatments / Results  Labs (all labs ordered are listed, but only abnormal results are displayed) Labs Reviewed - No data to display  EKG   Radiology DG Nasal Bones Result Date: 05/16/2024 CLINICAL DATA:  trauma, edema and pain EXAM: NASAL BONES - 3+ VIEW COMPARISON:  None Available. FINDINGS: No fractures identified involving the nasal bones. Bony nasal septum appears midline. Possible opacification of the left ethmoid sinus may be artifactual secondary to positioning or true. Consider further evaluation with CT of the facial bones. IMPRESSION: 1. No definite fracture identified. 2. Possible opacification of the left ethmoid sinus may be artifactual secondary to positioning or true. Consider further evaluation with CT of the facial bones. Electronically Signed   By: Harrietta Sherry M.D.   On: 05/16/2024 12:40     Procedures Procedures (including critical care time)  Medications Ordered in UC Medications - No data to display  Initial Impression / Assessment and Plan / UC Course  I have reviewed the triage vital signs and the nursing notes.  Pertinent labs & imaging results that were available  during my care of the patient  were reviewed by me and considered in my medical decision making (see chart for details).      Pt is a 38 y.o.  female with 1 day of nasal pain after being struck by her dog.  Obtained nasal bone plain films. Personally reviewed by me.  Radiologist report reviewed and notes no definite fracture but there is a possible opacification of the left ethmoid sinus.  Patient is having sinus congestion as well that was prior to her nose strike.  Suspect her symptoms are viral in nature.  Antibiotics deferred at this time.  I did advise patient if symptoms do not improve that she may need a CT.  Understanding voiced.   Patient to gradually return to normal activities, as tolerated and continue ordinary activities within the limits permitted by pain. Use Motrin  and/or Tylenol  PRN.  Ice the area.   Return and ED precautions given. Understanding voiced. Discussed MDM, treatment plan and plan for follow-up with patient who agrees with plan.   Final Clinical Impressions(s) / UC Diagnoses   Final diagnoses:  Nose injury, initial encounter     Discharge Instructions      Your nasal bones are not broken. You did have some opacification of the left ethmoid sinus which indicates that the sinus is filled with something other than air, likely due to inflammation or infection.  This could be better charterized on CT scan which can be ordered by your primary care provider or performed in the emergency department.       ED Prescriptions   None    PDMP not reviewed this encounter.   Sinda Leedom, DO 05/19/24 1047

## 2024-05-16 NOTE — ED Triage Notes (Signed)
 Pt c/o nose pain x1 day. States she was bending over & her dog was trying to jump up into her bed & as he jumped his skull hit her nose. Had nose bleed that lasted 10 min.  Today having clear drainage from L nostril. Denies any LOC.

## 2024-05-16 NOTE — Discharge Instructions (Signed)
 Your nasal bones are not broken. You did have some opacification of the left ethmoid sinus which indicates that the sinus is filled with something other than air, likely due to inflammation or infection.  This could be better charterized on CT scan which can be ordered by your primary care provider or performed in the emergency department.

## 2024-05-17 ENCOUNTER — Ambulatory Visit: Payer: Self-pay

## 2024-07-04 ENCOUNTER — Ambulatory Visit: Payer: Self-pay | Admitting: Nurse Practitioner

## 2024-07-28 ENCOUNTER — Telehealth: Payer: Self-pay

## 2024-07-28 NOTE — Telephone Encounter (Signed)
 Called patient to advise that her PCP has been changed to Jolene Cannady, DNP as requested.

## 2024-09-08 NOTE — Telephone Encounter (Signed)
 PCP changed to [facility] as requested.

## 2024-09-20 ENCOUNTER — Ambulatory Visit
Admission: EM | Admit: 2024-09-20 | Discharge: 2024-09-20 | Disposition: A | Discharge status: Home / Self Care | Attending: Emergency Medicine | Admitting: Emergency Medicine

## 2024-09-20 DIAGNOSIS — M26622 Arthralgia of left temporomandibular joint: Secondary | ICD-10-CM

## 2024-09-20 DIAGNOSIS — H9202 Otalgia, left ear: Secondary | ICD-10-CM

## 2024-09-20 MED ORDER — PREDNISONE 20 MG PO TABS: 40.0000 mg | ORAL_TABLET | Freq: Every day | ORAL | 0 refills | Status: AC

## 2024-09-20 MED ORDER — CYCLOBENZAPRINE HCL 10 MG PO TABS: 10.0000 mg | ORAL_TABLET | Freq: Every day | ORAL | 0 refills | Status: AC

## 2024-09-20 MED ORDER — NAPROXEN 500 MG PO TABS: 500.0000 mg | ORAL_TABLET | Freq: Two times a day (BID) | ORAL | 0 refills | Status: AC

## 2024-09-20 NOTE — ED Triage Notes (Signed)
 Patient presents to UC for left ear pain, neck stiffness x 3 days. Pain worse when touching ear. Treating pain with decongestant, left over Rx of antibiotic ear drops with no relief. She reports having runny nose and post nasal drip prior to ear pain.

## 2024-09-20 NOTE — ED Provider Notes (Signed)
 HPI  SUBJECTIVE:  Kimberly Neal is a 38 y.o. female who presents with 3 days of left ear pain and pressure after having a URI with nasal congestion, sore throat and rhinorrhea last week.  The URI symptoms have resolved.  She reports decreased hearing and states the pain radiates down her neck.  No otorrhea, fevers, dental pain.  No antibiotics in the past month.  No antipyretic in the past 6 hours.  She has tried DayQuil, NyQuil, heating pad, and leftover antibiotics for otitis externa without improvement in her symptoms.  Symptoms are worse when she tilts her head to the left.  Does not associate with chewing, yawning pain.  She has been clenching her jaw recently while taking new ADHD medications.  She does not know if she grinds her teeth at night.  She has a past medical history of smoking, ADHD, status post tonsillectomy/adenoidectomy and tubal ligation.  LMP: December 5.  Denies possibility of being pregnant.  PCP: [facility]  Past Medical History:  Diagnosis Date   DDD (degenerative disc disease), lumbar    HPV in female    Smoker    UTI (urinary tract infection)     Past Surgical History:  Procedure Laterality Date   BUNIONECTOMY Right 12/15/2022   TONSILLECTOMY AND ADENOIDECTOMY     TUBAL LIGATION  01/10/2018    Family History  Problem Relation Age of Onset   COPD Father    Leukemia Father    Hypertension Brother    Hyperlipidemia Brother    Asthma Son    Heart disease Maternal Grandmother    Diabetes Maternal Grandmother    Lung cancer Maternal Grandmother    Lung disease Maternal Grandfather    Kidney disease Paternal Grandmother    Diabetes Paternal Grandmother    Leukemia Paternal Grandfather     Social History[1]  Current Medications[2]  Allergies[3]   ROS  As noted in HPI.   Physical Exam  BP 104/75 (BP Location: Left Arm)   Pulse 64   Temp 98.2 F (36.8 C) (Oral)   Resp 16   Wt 73.5 kg   LMP 09/02/2024 (Exact Date)   SpO2  100%   BMI 26.96 kg/m   Constitutional: Well developed, well nourished, no acute distress Eyes:  EOMI, conjunctiva normal bilaterally HENT: Normocephalic, atraumatic,mucus membranes moist.  Decreased hearing left ear compared to right. Left external ear, EAC, TM normal.  No pain with traction on pinna, palpation of mastoid.  No erythema or swelling at the mastoid.  Tenderness over the tragus.  No tenderness over the TMJ in the EAC.  No TMJ crepitus. Right external ear, EAC, TM normal. Dentition intact Neck: Positive for left-sided trapezial tenderness, spasm.  No cervical lymphadenopathy. Respiratory: Normal inspiratory effort Cardiovascular: Normal rate GI: nondistended skin: No rash, skin intact Musculoskeletal: no deformities Neurologic: Alert & oriented x 3, no focal neuro deficits Psychiatric: Speech and behavior appropriate   ED Course   Medications - No data to display  No orders of the defined types were placed in this encounter.   No results found for this or any previous visit (from the past 24 hours). No results found.  ED Clinical Impression  1. Otalgia of left ear   2. Arthralgia of left temporomandibular joint      ED Assessment/Plan     Presentation consistent with TMJ arthralgia.  No evidence of otitis externa, otitis media, mastoiditis.  Home with Naprosyn /Tylenol  twice daily, prednisone  40 mg for 5 days, soft  diet, Flexeril  10 mg nightly.  Follow-up with PCP if not better in a week..  Discussed MDM, treatment plan, and plan for follow-up with patient. patient agrees with plan.   Meds ordered this encounter  Medications   cyclobenzaprine  (FLEXERIL ) 10 MG tablet    Sig: Take 1 tablet (10 mg total) by mouth at bedtime.    Dispense:  20 tablet    Refill:  0   naproxen  (NAPROSYN ) 500 MG tablet    Sig: Take 1 tablet (500 mg total) by mouth 2 (two) times daily.    Dispense:  20 tablet    Refill:  0   predniSONE  (DELTASONE ) 20 MG tablet    Sig: Take  2 tablets (40 mg total) by mouth daily with breakfast for 5 days.    Dispense:  10 tablet    Refill:  0      *This clinic note was created using Scientist, clinical (histocompatibility and immunogenetics). Therefore, there may be occasional mistakes despite careful proofreading.  ?     [1]  Social History Tobacco Use   Smoking status: Every Day    Current packs/day: 0.50    Types: Cigarettes   Smokeless tobacco: Never  Vaping Use   Vaping status: Never Used  Substance Use Topics   Alcohol use: Not Currently   Drug use: No  [2] No current facility-administered medications for this encounter.  Current Outpatient Medications:    cyclobenzaprine  (FLEXERIL ) 10 MG tablet, Take 1 tablet (10 mg total) by mouth at bedtime., Disp: 20 tablet, Rfl: 0   naproxen  (NAPROSYN ) 500 MG tablet, Take 1 tablet (500 mg total) by mouth 2 (two) times daily., Disp: 20 tablet, Rfl: 0   predniSONE  (DELTASONE ) 20 MG tablet, Take 2 tablets (40 mg total) by mouth daily with breakfast for 5 days., Disp: 10 tablet, Rfl: 0 [3] No Known Allergies    Van Knee, MD 09/20/24 1506

## 2024-09-20 NOTE — Discharge Instructions (Signed)
 Take 500 mg of Naprosyn  with 1000 mg of Tylenol  twice a day.  Finish the prednisone , even if you feel better.  Soft diet for the next 4 to 5 days.  Flexeril  at night to help prevent you from grinding your teeth or clenching her jaw and your sleep.  Follow-up with a dentist to get a bite guard.
